# Patient Record
Sex: Female | Born: 1990 | Race: White | Hispanic: No | Marital: Married | Smoking: Never smoker
Health system: Southern US, Community
[De-identification: ages and names within clinical notes are randomized; demographics above are authoritative.]

## PROBLEM LIST (undated history)

## (undated) DIAGNOSIS — E039 Hypothyroidism, unspecified: Secondary | ICD-10-CM

## (undated) HISTORY — PX: NO PAST SURGERIES: SHX2092

---
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
## 2017-08-21 DIAGNOSIS — D225 Melanocytic nevi of trunk: Secondary | ICD-10-CM | POA: Diagnosis not present

## 2017-08-21 DIAGNOSIS — D485 Neoplasm of uncertain behavior of skin: Secondary | ICD-10-CM | POA: Diagnosis not present

## 2017-08-21 DIAGNOSIS — B360 Pityriasis versicolor: Secondary | ICD-10-CM | POA: Diagnosis not present

## 2017-08-29 DIAGNOSIS — R5383 Other fatigue: Secondary | ICD-10-CM | POA: Diagnosis not present

## 2017-08-29 DIAGNOSIS — Z6821 Body mass index (BMI) 21.0-21.9, adult: Secondary | ICD-10-CM | POA: Diagnosis not present

## 2017-08-29 DIAGNOSIS — R002 Palpitations: Secondary | ICD-10-CM | POA: Diagnosis not present

## 2017-08-29 DIAGNOSIS — D510 Vitamin B12 deficiency anemia due to intrinsic factor deficiency: Secondary | ICD-10-CM | POA: Diagnosis not present

## 2017-10-03 DIAGNOSIS — Z01419 Encounter for gynecological examination (general) (routine) without abnormal findings: Secondary | ICD-10-CM | POA: Diagnosis not present

## 2017-10-03 DIAGNOSIS — R5383 Other fatigue: Secondary | ICD-10-CM | POA: Diagnosis not present

## 2017-10-03 DIAGNOSIS — Z6822 Body mass index (BMI) 22.0-22.9, adult: Secondary | ICD-10-CM | POA: Diagnosis not present

## 2017-12-18 DIAGNOSIS — E538 Deficiency of other specified B group vitamins: Secondary | ICD-10-CM | POA: Diagnosis not present

## 2017-12-18 DIAGNOSIS — M791 Myalgia, unspecified site: Secondary | ICD-10-CM | POA: Diagnosis not present

## 2017-12-18 DIAGNOSIS — R5383 Other fatigue: Secondary | ICD-10-CM | POA: Diagnosis not present

## 2017-12-18 DIAGNOSIS — R140 Abdominal distension (gaseous): Secondary | ICD-10-CM | POA: Diagnosis not present

## 2017-12-18 DIAGNOSIS — E049 Nontoxic goiter, unspecified: Secondary | ICD-10-CM | POA: Diagnosis not present

## 2018-02-03 DIAGNOSIS — D510 Vitamin B12 deficiency anemia due to intrinsic factor deficiency: Secondary | ICD-10-CM | POA: Diagnosis not present

## 2018-02-03 DIAGNOSIS — K581 Irritable bowel syndrome with constipation: Secondary | ICD-10-CM | POA: Diagnosis not present

## 2018-02-03 DIAGNOSIS — R140 Abdominal distension (gaseous): Secondary | ICD-10-CM | POA: Diagnosis not present

## 2018-02-26 DIAGNOSIS — E049 Nontoxic goiter, unspecified: Secondary | ICD-10-CM | POA: Diagnosis not present

## 2018-02-26 DIAGNOSIS — E538 Deficiency of other specified B group vitamins: Secondary | ICD-10-CM | POA: Diagnosis not present

## 2018-02-26 DIAGNOSIS — M791 Myalgia, unspecified site: Secondary | ICD-10-CM | POA: Diagnosis not present

## 2018-03-13 DIAGNOSIS — R140 Abdominal distension (gaseous): Secondary | ICD-10-CM | POA: Diagnosis not present

## 2018-03-13 DIAGNOSIS — R1013 Epigastric pain: Secondary | ICD-10-CM | POA: Diagnosis not present

## 2018-06-10 DIAGNOSIS — E559 Vitamin D deficiency, unspecified: Secondary | ICD-10-CM | POA: Diagnosis not present

## 2018-06-10 DIAGNOSIS — E538 Deficiency of other specified B group vitamins: Secondary | ICD-10-CM | POA: Diagnosis not present

## 2018-06-10 DIAGNOSIS — E049 Nontoxic goiter, unspecified: Secondary | ICD-10-CM | POA: Diagnosis not present

## 2018-06-30 DIAGNOSIS — J302 Other seasonal allergic rhinitis: Secondary | ICD-10-CM | POA: Diagnosis not present

## 2018-06-30 DIAGNOSIS — J329 Chronic sinusitis, unspecified: Secondary | ICD-10-CM | POA: Diagnosis not present

## 2018-06-30 DIAGNOSIS — J40 Bronchitis, not specified as acute or chronic: Secondary | ICD-10-CM | POA: Diagnosis not present

## 2018-10-07 DIAGNOSIS — Z6822 Body mass index (BMI) 22.0-22.9, adult: Secondary | ICD-10-CM | POA: Diagnosis not present

## 2018-10-07 DIAGNOSIS — Z01419 Encounter for gynecological examination (general) (routine) without abnormal findings: Secondary | ICD-10-CM | POA: Diagnosis not present

## 2018-11-10 DIAGNOSIS — E039 Hypothyroidism, unspecified: Secondary | ICD-10-CM | POA: Diagnosis not present

## 2018-11-10 DIAGNOSIS — E559 Vitamin D deficiency, unspecified: Secondary | ICD-10-CM | POA: Diagnosis not present

## 2018-11-10 DIAGNOSIS — Z23 Encounter for immunization: Secondary | ICD-10-CM | POA: Diagnosis not present

## 2018-11-10 DIAGNOSIS — R5383 Other fatigue: Secondary | ICD-10-CM | POA: Diagnosis not present

## 2018-11-10 DIAGNOSIS — E049 Nontoxic goiter, unspecified: Secondary | ICD-10-CM | POA: Diagnosis not present

## 2018-11-10 DIAGNOSIS — E538 Deficiency of other specified B group vitamins: Secondary | ICD-10-CM | POA: Diagnosis not present

## 2018-12-23 DIAGNOSIS — Z20828 Contact with and (suspected) exposure to other viral communicable diseases: Secondary | ICD-10-CM | POA: Diagnosis not present

## 2018-12-23 DIAGNOSIS — J3489 Other specified disorders of nose and nasal sinuses: Secondary | ICD-10-CM | POA: Diagnosis not present

## 2019-01-15 DIAGNOSIS — E559 Vitamin D deficiency, unspecified: Secondary | ICD-10-CM | POA: Diagnosis not present

## 2019-03-01 DIAGNOSIS — F4323 Adjustment disorder with mixed anxiety and depressed mood: Secondary | ICD-10-CM | POA: Diagnosis not present

## 2019-03-08 DIAGNOSIS — F4323 Adjustment disorder with mixed anxiety and depressed mood: Secondary | ICD-10-CM | POA: Diagnosis not present

## 2019-03-15 DIAGNOSIS — F4323 Adjustment disorder with mixed anxiety and depressed mood: Secondary | ICD-10-CM | POA: Diagnosis not present

## 2019-03-22 DIAGNOSIS — F4323 Adjustment disorder with mixed anxiety and depressed mood: Secondary | ICD-10-CM | POA: Diagnosis not present

## 2019-03-29 DIAGNOSIS — F4323 Adjustment disorder with mixed anxiety and depressed mood: Secondary | ICD-10-CM | POA: Diagnosis not present

## 2019-04-05 DIAGNOSIS — F4323 Adjustment disorder with mixed anxiety and depressed mood: Secondary | ICD-10-CM | POA: Diagnosis not present

## 2019-04-08 DIAGNOSIS — R5383 Other fatigue: Secondary | ICD-10-CM | POA: Diagnosis not present

## 2019-04-08 DIAGNOSIS — E049 Nontoxic goiter, unspecified: Secondary | ICD-10-CM | POA: Diagnosis not present

## 2019-04-12 DIAGNOSIS — F4323 Adjustment disorder with mixed anxiety and depressed mood: Secondary | ICD-10-CM | POA: Diagnosis not present

## 2019-04-13 DIAGNOSIS — N911 Secondary amenorrhea: Secondary | ICD-10-CM | POA: Diagnosis not present

## 2019-04-20 DIAGNOSIS — N911 Secondary amenorrhea: Secondary | ICD-10-CM | POA: Diagnosis not present

## 2019-04-21 DIAGNOSIS — Z31430 Encounter of female for testing for genetic disease carrier status for procreative management: Secondary | ICD-10-CM | POA: Diagnosis not present

## 2019-04-21 DIAGNOSIS — Z3685 Encounter for antenatal screening for Streptococcus B: Secondary | ICD-10-CM | POA: Diagnosis not present

## 2019-04-21 DIAGNOSIS — Z3481 Encounter for supervision of other normal pregnancy, first trimester: Secondary | ICD-10-CM | POA: Diagnosis not present

## 2019-04-21 LAB — OB RESULTS CONSOLE ABO/RH: RH Type: POSITIVE

## 2019-04-21 LAB — OB RESULTS CONSOLE HEPATITIS B SURFACE ANTIGEN: Hepatitis B Surface Ag: NEGATIVE

## 2019-04-21 LAB — OB RESULTS CONSOLE HIV ANTIBODY (ROUTINE TESTING): HIV: NONREACTIVE

## 2019-04-21 LAB — OB RESULTS CONSOLE RPR: RPR: NONREACTIVE

## 2019-04-21 LAB — OB RESULTS CONSOLE RUBELLA ANTIBODY, IGM: Rubella: IMMUNE

## 2019-05-10 DIAGNOSIS — Z113 Encounter for screening for infections with a predominantly sexual mode of transmission: Secondary | ICD-10-CM | POA: Diagnosis not present

## 2019-05-10 DIAGNOSIS — O26891 Other specified pregnancy related conditions, first trimester: Secondary | ICD-10-CM | POA: Diagnosis not present

## 2019-05-10 DIAGNOSIS — Z3400 Encounter for supervision of normal first pregnancy, unspecified trimester: Secondary | ICD-10-CM | POA: Diagnosis not present

## 2019-05-10 DIAGNOSIS — Z331 Pregnant state, incidental: Secondary | ICD-10-CM | POA: Diagnosis not present

## 2019-05-10 DIAGNOSIS — Z3A10 10 weeks gestation of pregnancy: Secondary | ICD-10-CM | POA: Diagnosis not present

## 2019-05-11 LAB — OB RESULTS CONSOLE GC/CHLAMYDIA
Chlamydia: NEGATIVE
Gonorrhea: NEGATIVE

## 2019-05-20 DIAGNOSIS — Z3481 Encounter for supervision of other normal pregnancy, first trimester: Secondary | ICD-10-CM | POA: Diagnosis not present

## 2019-05-20 DIAGNOSIS — Z3682 Encounter for antenatal screening for nuchal translucency: Secondary | ICD-10-CM | POA: Diagnosis not present

## 2019-05-20 DIAGNOSIS — Z3A12 12 weeks gestation of pregnancy: Secondary | ICD-10-CM | POA: Diagnosis not present

## 2019-06-15 DIAGNOSIS — N390 Urinary tract infection, site not specified: Secondary | ICD-10-CM | POA: Diagnosis not present

## 2019-06-16 DIAGNOSIS — E538 Deficiency of other specified B group vitamins: Secondary | ICD-10-CM | POA: Diagnosis not present

## 2019-06-16 DIAGNOSIS — E559 Vitamin D deficiency, unspecified: Secondary | ICD-10-CM | POA: Diagnosis not present

## 2019-06-16 DIAGNOSIS — E049 Nontoxic goiter, unspecified: Secondary | ICD-10-CM | POA: Diagnosis not present

## 2019-06-16 DIAGNOSIS — R5383 Other fatigue: Secondary | ICD-10-CM | POA: Diagnosis not present

## 2019-07-06 DIAGNOSIS — F4321 Adjustment disorder with depressed mood: Secondary | ICD-10-CM | POA: Diagnosis not present

## 2019-07-13 DIAGNOSIS — Z1152 Encounter for screening for COVID-19: Secondary | ICD-10-CM | POA: Diagnosis not present

## 2019-07-13 DIAGNOSIS — Z363 Encounter for antenatal screening for malformations: Secondary | ICD-10-CM | POA: Diagnosis not present

## 2019-07-13 DIAGNOSIS — Z3A19 19 weeks gestation of pregnancy: Secondary | ICD-10-CM | POA: Diagnosis not present

## 2019-07-13 DIAGNOSIS — Z361 Encounter for antenatal screening for raised alphafetoprotein level: Secondary | ICD-10-CM | POA: Diagnosis not present

## 2019-07-16 DIAGNOSIS — F4321 Adjustment disorder with depressed mood: Secondary | ICD-10-CM | POA: Diagnosis not present

## 2019-07-28 DIAGNOSIS — E049 Nontoxic goiter, unspecified: Secondary | ICD-10-CM | POA: Diagnosis not present

## 2019-07-30 DIAGNOSIS — F4321 Adjustment disorder with depressed mood: Secondary | ICD-10-CM | POA: Diagnosis not present

## 2019-08-12 DIAGNOSIS — N390 Urinary tract infection, site not specified: Secondary | ICD-10-CM | POA: Diagnosis not present

## 2019-08-13 DIAGNOSIS — F4321 Adjustment disorder with depressed mood: Secondary | ICD-10-CM | POA: Diagnosis not present

## 2019-08-17 DIAGNOSIS — Z3A24 24 weeks gestation of pregnancy: Secondary | ICD-10-CM | POA: Diagnosis not present

## 2019-08-17 DIAGNOSIS — Z362 Encounter for other antenatal screening follow-up: Secondary | ICD-10-CM | POA: Diagnosis not present

## 2019-08-27 DIAGNOSIS — F4321 Adjustment disorder with depressed mood: Secondary | ICD-10-CM | POA: Diagnosis not present

## 2019-09-08 DIAGNOSIS — Z23 Encounter for immunization: Secondary | ICD-10-CM | POA: Diagnosis not present

## 2019-09-08 DIAGNOSIS — R030 Elevated blood-pressure reading, without diagnosis of hypertension: Secondary | ICD-10-CM | POA: Diagnosis not present

## 2019-09-10 DIAGNOSIS — F4321 Adjustment disorder with depressed mood: Secondary | ICD-10-CM | POA: Diagnosis not present

## 2019-09-24 DIAGNOSIS — F4321 Adjustment disorder with depressed mood: Secondary | ICD-10-CM | POA: Diagnosis not present

## 2019-09-30 ENCOUNTER — Other Ambulatory Visit: Payer: Self-pay

## 2019-09-30 ENCOUNTER — Inpatient Hospital Stay (HOSPITAL_COMMUNITY)
Admission: AD | Admit: 2019-09-30 | Discharge: 2019-09-30 | Disposition: A | Discharge status: Home / Self Care | Payer: BC Managed Care – PPO | Attending: Obstetrics & Gynecology | Admitting: Obstetrics & Gynecology

## 2019-09-30 ENCOUNTER — Encounter (HOSPITAL_COMMUNITY): Payer: Self-pay | Admitting: Obstetrics & Gynecology

## 2019-09-30 DIAGNOSIS — E039 Hypothyroidism, unspecified: Secondary | ICD-10-CM | POA: Diagnosis not present

## 2019-09-30 DIAGNOSIS — Z79890 Hormone replacement therapy (postmenopausal): Secondary | ICD-10-CM | POA: Insufficient documentation

## 2019-09-30 DIAGNOSIS — O99283 Endocrine, nutritional and metabolic diseases complicating pregnancy, third trimester: Secondary | ICD-10-CM | POA: Diagnosis not present

## 2019-09-30 DIAGNOSIS — Z3A31 31 weeks gestation of pregnancy: Secondary | ICD-10-CM | POA: Diagnosis not present

## 2019-09-30 DIAGNOSIS — O133 Gestational [pregnancy-induced] hypertension without significant proteinuria, third trimester: Secondary | ICD-10-CM | POA: Diagnosis not present

## 2019-09-30 DIAGNOSIS — Z3689 Encounter for other specified antenatal screening: Secondary | ICD-10-CM

## 2019-09-30 HISTORY — DX: Hypothyroidism, unspecified: E03.9

## 2019-09-30 LAB — COMPREHENSIVE METABOLIC PANEL
ALT: 17 U/L (ref 0–44)
AST: 20 U/L (ref 15–41)
Albumin: 2.6 g/dL — ABNORMAL LOW (ref 3.5–5.0)
Alkaline Phosphatase: 85 U/L (ref 38–126)
Anion gap: 10 (ref 5–15)
BUN: 7 mg/dL (ref 6–20)
CO2: 20 mmol/L — ABNORMAL LOW (ref 22–32)
Calcium: 9.4 mg/dL (ref 8.9–10.3)
Chloride: 108 mmol/L (ref 98–111)
Creatinine, Ser: 0.72 mg/dL (ref 0.44–1.00)
GFR calc Af Amer: >60 mL/min (ref >60)
GFR calc non Af Amer: >60 mL/min (ref >60)
Glucose, Bld: 85 mg/dL (ref 70–99)
Potassium: 3.5 mmol/L (ref 3.5–5.1)
Sodium: 138 mmol/L (ref 135–145)
Total Bilirubin: 0.4 mg/dL (ref 0.3–1.2)
Total Protein: 5.1 g/dL — ABNORMAL LOW (ref 6.5–8.1)

## 2019-09-30 LAB — CBC
HCT: 29.6 % — ABNORMAL LOW (ref 36.0–46.0)
Hemoglobin: 10.2 g/dL — ABNORMAL LOW (ref 12.0–15.0)
MCH: 30.4 pg (ref 26.0–34.0)
MCHC: 34.5 g/dL (ref 30.0–36.0)
MCV: 88.1 fL (ref 80.0–100.0)
Platelets: 151 10*3/uL (ref 150–400)
RBC: 3.36 MIL/uL — ABNORMAL LOW (ref 3.87–5.11)
RDW: 12.3 % (ref 11.5–15.5)
WBC: 8.8 10*3/uL (ref 4.0–10.5)
nRBC: 0 % (ref 0.0–0.2)

## 2019-09-30 LAB — PROTEIN / CREATININE RATIO, URINE
Creatinine, Urine: 56.88 mg/dL
Total Protein, Urine: <6 mg/dL

## 2019-09-30 MED ORDER — ACETAMINOPHEN 500 MG PO TABS
1000.0000 mg | ORAL_TABLET | Freq: Four times a day (QID) | ORAL | Status: DC | PRN
  Administered 2019-09-30: 1000 mg via ORAL
  Filled 2019-09-30: qty 2

## 2019-09-30 NOTE — MAU Note (Signed)
Pt sent from doctors office with elevated b/p. C/O mild headache, nausea  and swollen legs and feet(has had that for several weeks). Reports good fetal movement.

## 2019-09-30 NOTE — Discharge Instructions (Signed)

## 2019-09-30 NOTE — MAU Provider Note (Signed)
History     CSN: [id number]  Arrival date and time: 09/30/19 1104   First Provider Initiated Contact with Patient 09/30/19 1153      Chief Complaint  Patient presents with  . Hypertension   28 y.o. G1 @31 .1 wks presenting from office for HTN. Reports increased BPs over the last few visits. Reports generalized HA this am. Rates 3/10. Has not taken anything for it. Denies visual disturbances, CP, RUQ pain, and SOB. Reports good FM. Reports edema of LE and feet for weeks.   OB History    Gravida  1   Para      Term      Preterm      AB      Living        SAB      TAB      Ectopic      Multiple      Live Births              Past Medical History:  Diagnosis Date  . Hypothyroidism     Past Surgical History:  Procedure Laterality Date  . NO PAST SURGERIES      No family history on file.  Social History   Tobacco Use  . Smoking status: Never Smoker  . Smokeless tobacco: Never Used  Substance Use Topics  . Alcohol use: Not Currently  . Drug use: Never    Allergies: No Known Allergies  No medications prior to admission.    Review of Systems  Eyes: Negative for visual disturbance.  Respiratory: Negative for shortness of breath.   Cardiovascular: Negative for chest pain.  Gastrointestinal: Negative for abdominal pain.  Genitourinary: Negative for vaginal bleeding.  Neurological: Positive for headaches.   Physical Exam   Blood pressure 133/89, pulse 62, temperature 99 F (37.2 C), resp. rate 18, height 5\' 7"  (1.702 m), weight 80.7 kg. Patient Vitals for the past 24 hrs:  BP Temp Pulse Resp Height Weight  09/30/19 1400 133/89 -- 62 -- -- --  09/30/19 1345 133/88 -- (!) 57 -- -- --  09/30/19 1330 137/87 -- 62 -- -- --  09/30/19 1315 135/87 -- 62 -- -- --  09/30/19 1300 133/79 -- (!) 59 -- -- --  09/30/19 1245 133/85 -- 63 -- -- --  09/30/19 1230 137/83 -- (!) 55 -- -- --  09/30/19 1215 138/84 -- 71 -- -- --  09/30/19 1200 (!) 147/92 -- 75  -- -- --  09/30/19 1145 138/85 -- 77 -- -- --  09/30/19 1132 (!) 143/101 99 F (37.2 C) 71 18 5\' 7"  (1.702 m) 80.7 kg   Physical Exam Vitals and nursing note reviewed.  Constitutional:      General: She is not in acute distress.    Appearance: Normal appearance.  HENT:     Head: Normocephalic and atraumatic.  Cardiovascular:     Rate and Rhythm: Normal rate.  Pulmonary:     Effort: Pulmonary effort is normal. No respiratory distress.  Musculoskeletal:        General: Swelling (2+ BLE ) present.     Cervical back: Normal range of motion.  Skin:    General: Skin is warm and dry.  Neurological:     General: No focal deficit present.     Mental Status: She is alert and oriented to person, place, and time.  Psychiatric:        Mood and Affect: Mood normal.        Behavior:  Behavior normal.   EFM: 135 bpm, mod variability, + accels, no decels Toco: UI  Results for orders placed or performed during the hospital encounter of 09/30/19 (from the past 24 hour(s))  Protein / creatinine ratio, urine     Status: None   Collection Time: 09/30/19 11:35 AM  Result Value Ref Range   Creatinine, Urine 56.88 mg/dL   Total Protein, Urine <6 mg/dL   Protein Creatinine Ratio        0.00 - 0.15 mg/mg[Cre]  CBC     Status: Abnormal   Collection Time: 09/30/19 11:43 AM  Result Value Ref Range   WBC 8.8 4.0 - 10.5 K/uL   RBC 3.36 (L) 3.87 - 5.11 MIL/uL   Hemoglobin 10.2 (L) 12.0 - 15.0 g/dL   HCT 11.9 (L) 36 - 46 %   MCV 88.1 80.0 - 100.0 fL   MCH 30.4 26.0 - 34.0 pg   MCHC 34.5 30.0 - 36.0 g/dL   RDW 41.7 40.8 - 14.4 %   Platelets 151 150 - 400 K/uL   nRBC 0.0 0.0 - 0.2 %  Comprehensive metabolic panel     Status: Abnormal   Collection Time: 09/30/19 11:43 AM  Result Value Ref Range   Sodium 138 135 - 145 mmol/L   Potassium 3.5 3.5 - 5.1 mmol/L   Chloride 108 98 - 111 mmol/L   CO2 20 (L) 22 - 32 mmol/L   Glucose, Bld 85 70 - 99 mg/dL   BUN 7 6 - 20 mg/dL   Creatinine, Ser 8.18 0.44  - 1.00 mg/dL   Calcium 9.4 8.9 - 56.3 mg/dL   Total Protein 5.1 (L) 6.5 - 8.1 g/dL   Albumin 2.6 (L) 3.5 - 5.0 g/dL   AST 20 15 - 41 U/L   ALT 17 0 - 44 U/L   Alkaline Phosphatase 85 38 - 126 U/L   Total Bilirubin 0.4 0.3 - 1.2 mg/dL   GFR calc non Af Amer >60 >60 mL/min   GFR calc Af Amer >60 >60 mL/min   Anion gap 10 5 - 15   MAU Course  Procedures Tylenol  MDM Labs ordered and reviewed. HA improved. No evidence of PEC. Recommend f/u early next week. Stable for discharge home.  Assessment and Plan   1. [redacted] weeks gestation of pregnancy   2. NST (non-stress test) reactive   3. Gestational hypertension, third trimester    Discharge home Follow up at [facility] next week- message left with office PEC precautions  Allergies as of 09/30/2019   No Known Allergies     Medication List    TAKE these medications   cholecalciferol 25 MCG (1000 UNIT) tablet Commonly known as: VITAMIN D3 Take 1,000 Units by mouth daily.   levothyroxine 25 MCG tablet Commonly known as: SYNTHROID Take 25 mcg by mouth daily before breakfast.   prenatal multivitamin Tabs tablet Take 1 tablet by mouth daily at 12 noon.   vitamin B-12 500 MCG tablet Commonly known as: CYANOCOBALAMIN Take 500 mcg by mouth daily.      Donette Larry, CNM 09/30/2019, 3:38 PM

## 2019-10-04 DIAGNOSIS — Z3A31 31 weeks gestation of pregnancy: Secondary | ICD-10-CM | POA: Diagnosis not present

## 2019-10-04 DIAGNOSIS — O139 Gestational [pregnancy-induced] hypertension without significant proteinuria, unspecified trimester: Secondary | ICD-10-CM | POA: Diagnosis not present

## 2019-10-08 DIAGNOSIS — F4321 Adjustment disorder with depressed mood: Secondary | ICD-10-CM | POA: Diagnosis not present

## 2019-10-11 ENCOUNTER — Inpatient Hospital Stay (HOSPITAL_COMMUNITY)
Admission: AD | Admit: 2019-10-11 | Discharge: 2019-10-18 | DRG: 806 | Disposition: A | Discharge status: Home / Self Care | Payer: BC Managed Care – PPO | Attending: Obstetrics and Gynecology | Admitting: Obstetrics and Gynecology

## 2019-10-11 ENCOUNTER — Encounter (HOSPITAL_COMMUNITY): Payer: Self-pay | Admitting: Obstetrics & Gynecology

## 2019-10-11 ENCOUNTER — Other Ambulatory Visit: Payer: Self-pay

## 2019-10-11 DIAGNOSIS — O99284 Endocrine, nutritional and metabolic diseases complicating childbirth: Secondary | ICD-10-CM | POA: Diagnosis not present

## 2019-10-11 DIAGNOSIS — E039 Hypothyroidism, unspecified: Secondary | ICD-10-CM | POA: Diagnosis not present

## 2019-10-11 DIAGNOSIS — Z20822 Contact with and (suspected) exposure to covid-19: Secondary | ICD-10-CM | POA: Diagnosis not present

## 2019-10-11 DIAGNOSIS — D696 Thrombocytopenia, unspecified: Secondary | ICD-10-CM | POA: Diagnosis present

## 2019-10-11 DIAGNOSIS — Z3A33 33 weeks gestation of pregnancy: Secondary | ICD-10-CM | POA: Diagnosis not present

## 2019-10-11 DIAGNOSIS — R030 Elevated blood-pressure reading, without diagnosis of hypertension: Secondary | ICD-10-CM | POA: Diagnosis not present

## 2019-10-11 DIAGNOSIS — Z3A00 Weeks of gestation of pregnancy not specified: Secondary | ICD-10-CM | POA: Diagnosis not present

## 2019-10-11 DIAGNOSIS — O43899 Other placental disorders, unspecified trimester: Secondary | ICD-10-CM | POA: Diagnosis not present

## 2019-10-11 DIAGNOSIS — Z3A32 32 weeks gestation of pregnancy: Secondary | ICD-10-CM | POA: Diagnosis not present

## 2019-10-11 DIAGNOSIS — O139 Gestational [pregnancy-induced] hypertension without significant proteinuria, unspecified trimester: Secondary | ICD-10-CM | POA: Diagnosis present

## 2019-10-11 DIAGNOSIS — O1414 Severe pre-eclampsia complicating childbirth: Secondary | ICD-10-CM | POA: Diagnosis not present

## 2019-10-11 DIAGNOSIS — O9912 Other diseases of the blood and blood-forming organs and certain disorders involving the immune mechanism complicating childbirth: Secondary | ICD-10-CM | POA: Diagnosis present

## 2019-10-11 DIAGNOSIS — O1413 Severe pre-eclampsia, third trimester: Secondary | ICD-10-CM | POA: Diagnosis not present

## 2019-10-11 DIAGNOSIS — Z23 Encounter for immunization: Secondary | ICD-10-CM

## 2019-10-11 DIAGNOSIS — D649 Anemia, unspecified: Secondary | ICD-10-CM | POA: Diagnosis not present

## 2019-10-11 DIAGNOSIS — Z363 Encounter for antenatal screening for malformations: Secondary | ICD-10-CM | POA: Diagnosis not present

## 2019-10-11 DIAGNOSIS — O99283 Endocrine, nutritional and metabolic diseases complicating pregnancy, third trimester: Secondary | ICD-10-CM | POA: Diagnosis not present

## 2019-10-11 DIAGNOSIS — O164 Unspecified maternal hypertension, complicating childbirth: Secondary | ICD-10-CM | POA: Diagnosis not present

## 2019-10-11 DIAGNOSIS — O9902 Anemia complicating childbirth: Secondary | ICD-10-CM | POA: Diagnosis not present

## 2019-10-11 DIAGNOSIS — O1410 Severe pre-eclampsia, unspecified trimester: Secondary | ICD-10-CM | POA: Diagnosis present

## 2019-10-11 DIAGNOSIS — O133 Gestational [pregnancy-induced] hypertension without significant proteinuria, third trimester: Secondary | ICD-10-CM | POA: Diagnosis not present

## 2019-10-11 LAB — CBC
HCT: 28.5 % — ABNORMAL LOW (ref 36.0–46.0)
Hemoglobin: 9.6 g/dL — ABNORMAL LOW (ref 12.0–15.0)
MCH: 29.9 pg (ref 26.0–34.0)
MCHC: 33.7 g/dL (ref 30.0–36.0)
MCV: 88.8 fL (ref 80.0–100.0)
Platelets: 144 10*3/uL — ABNORMAL LOW (ref 150–400)
RBC: 3.21 MIL/uL — ABNORMAL LOW (ref 3.87–5.11)
RDW: 12.4 % (ref 11.5–15.5)
WBC: 8.1 10*3/uL (ref 4.0–10.5)
nRBC: 0 % (ref 0.0–0.2)

## 2019-10-11 LAB — PROTEIN / CREATININE RATIO, URINE
Creatinine, Urine: 58.49 mg/dL
Protein Creatinine Ratio: 0.99 mg/mg{Cre} — ABNORMAL HIGH (ref 0.00–0.15)
Total Protein, Urine: 58 mg/dL

## 2019-10-11 LAB — COMPREHENSIVE METABOLIC PANEL
ALT: 21 U/L (ref 0–44)
AST: 26 U/L (ref 15–41)
Albumin: 2.4 g/dL — ABNORMAL LOW (ref 3.5–5.0)
Alkaline Phosphatase: 108 U/L (ref 38–126)
Anion gap: 8 (ref 5–15)
BUN: 10 mg/dL (ref 6–20)
CO2: 23 mmol/L (ref 22–32)
Calcium: 8.6 mg/dL — ABNORMAL LOW (ref 8.9–10.3)
Chloride: 107 mmol/L (ref 98–111)
Creatinine, Ser: 0.88 mg/dL (ref 0.44–1.00)
GFR calc Af Amer: >60 mL/min (ref >60)
GFR calc non Af Amer: >60 mL/min (ref >60)
Glucose, Bld: 80 mg/dL (ref 70–99)
Potassium: 3.8 mmol/L (ref 3.5–5.1)
Sodium: 138 mmol/L (ref 135–145)
Total Bilirubin: 0.6 mg/dL (ref 0.3–1.2)
Total Protein: 4.9 g/dL — ABNORMAL LOW (ref 6.5–8.1)

## 2019-10-11 LAB — RESPIRATORY PANEL BY RT PCR (FLU A&B, COVID)
Influenza A by PCR: NEGATIVE
Influenza B by PCR: NEGATIVE
SARS Coronavirus 2 by RT PCR: NEGATIVE

## 2019-10-11 LAB — TYPE AND SCREEN
ABO/RH(D): A POS
Antibody Screen: NEGATIVE

## 2019-10-11 MED ORDER — LABETALOL HCL 200 MG PO TABS
300.0000 mg | ORAL_TABLET | Freq: Three times a day (TID) | ORAL | Status: DC
  Administered 2019-10-11 – 2019-10-13 (×7): 300 mg via ORAL
  Filled 2019-10-11 (×5): qty 1
  Filled 2019-10-11: qty 3
  Filled 2019-10-11: qty 1

## 2019-10-11 MED ORDER — LABETALOL HCL 5 MG/ML IV SOLN
40.0000 mg | INTRAVENOUS | Status: DC | PRN
  Administered 2019-10-11 – 2019-10-15 (×3): 40 mg via INTRAVENOUS
  Filled 2019-10-11 (×3): qty 8

## 2019-10-11 MED ORDER — BETAMETHASONE SOD PHOS & ACET 6 (3-3) MG/ML IJ SUSP
12.0000 mg | INTRAMUSCULAR | Status: AC
  Administered 2019-10-11 – 2019-10-12 (×2): 12 mg via INTRAMUSCULAR
  Filled 2019-10-11: qty 5

## 2019-10-11 MED ORDER — LABETALOL HCL 5 MG/ML IV SOLN
80.0000 mg | INTRAVENOUS | Status: DC | PRN
  Administered 2019-10-11 – 2019-10-15 (×3): 80 mg via INTRAVENOUS
  Filled 2019-10-11 (×3): qty 16

## 2019-10-11 MED ORDER — INFLUENZA VAC SPLIT QUAD 0.5 ML IM SUSY
0.5000 mL | PREFILLED_SYRINGE | INTRAMUSCULAR | Status: AC
  Administered 2019-10-12: 0.5 mL via INTRAMUSCULAR
  Filled 2019-10-11: qty 0.5

## 2019-10-11 MED ORDER — CALCIUM CARBONATE ANTACID 500 MG PO CHEW
2.0000 | CHEWABLE_TABLET | ORAL | Status: DC | PRN
  Administered 2019-10-11 – 2019-10-14 (×5): 400 mg via ORAL
  Filled 2019-10-11 (×5): qty 2

## 2019-10-11 MED ORDER — DOCUSATE SODIUM 100 MG PO CAPS
100.0000 mg | ORAL_CAPSULE | Freq: Every day | ORAL | Status: DC
  Administered 2019-10-12 – 2019-10-14 (×3): 100 mg via ORAL
  Filled 2019-10-11 (×3): qty 1

## 2019-10-11 MED ORDER — PRENATAL MULTIVITAMIN CH
1.0000 | ORAL_TABLET | Freq: Every day | ORAL | Status: DC
  Administered 2019-10-12 – 2019-10-14 (×3): 1 via ORAL
  Filled 2019-10-11 (×3): qty 1

## 2019-10-11 MED ORDER — HYDRALAZINE HCL 20 MG/ML IJ SOLN
10.0000 mg | INTRAMUSCULAR | Status: DC | PRN
  Administered 2019-10-11 – 2019-10-15 (×3): 10 mg via INTRAVENOUS
  Filled 2019-10-11 (×3): qty 1

## 2019-10-11 MED ORDER — LABETALOL HCL 5 MG/ML IV SOLN
20.0000 mg | INTRAVENOUS | Status: DC | PRN
  Administered 2019-10-11 – 2019-10-15 (×3): 20 mg via INTRAVENOUS
  Filled 2019-10-11 (×3): qty 4

## 2019-10-11 MED ORDER — LEVOTHYROXINE SODIUM 25 MCG PO TABS
25.0000 ug | ORAL_TABLET | Freq: Every day | ORAL | Status: DC
  Administered 2019-10-12 – 2019-10-15 (×4): 25 ug via ORAL
  Filled 2019-10-11 (×5): qty 1

## 2019-10-11 MED ORDER — ACETAMINOPHEN 325 MG PO TABS
650.0000 mg | ORAL_TABLET | ORAL | Status: DC | PRN
  Administered 2019-10-11 – 2019-10-15 (×4): 650 mg via ORAL
  Filled 2019-10-11 (×4): qty 2

## 2019-10-11 MED ORDER — ZOLPIDEM TARTRATE 5 MG PO TABS: 5.0000 mg | ORAL_TABLET | Freq: Every evening | ORAL | Status: DC | PRN

## 2019-10-11 NOTE — H&P (Signed)
Sally Cross is a 29 y.o. female G1 at [redacted]w[redacted]d presenting for elevated BPs.  She has been followed closely in the office for [facility] and has been taking labetalol 200 BID.  She has been out of work and on modified bedrest at home but today, noticed elevated BPs in 160s/100s.  She has gained 7 lbs in the last week and reports severe bilateral LE edema.  She was seen in the office where her BP was 175/100 and new 1+ protein.  She denies HA, vision change, RUQ pain, CP/SOB.  Active FM.  Last u/s in the office 9/27 with EFW 3#9 (47%) and normal AFI.  Labs in MAU wnl.  P:C pending on admission.  BMZ#1 received around 430pm.  Antepartum course complicated by hypothyroidism well controlled on synthroid 25 mcg.    OB History    Gravida  1   Para      Term      Preterm      AB      Living        SAB      TAB      Ectopic      Multiple      Live Births             Past Medical History:  Diagnosis Date  . Hypothyroidism    Past Surgical History:  Procedure Laterality Date  . NO PAST SURGERIES     Family History: family history is not on file. Social History:  reports that she has never smoked. She has never used smokeless tobacco. She reports previous alcohol use. She reports that she does not use drugs.     Maternal Diabetes: No Genetic Screening: Normal Maternal Ultrasounds/Referrals: Normal Fetal Ultrasounds or other Referrals:  None Maternal Substance Abuse:  No Significant Maternal Medications:  Meds include: Syntroid Other:  labetalol Significant Maternal Lab Results:  None Other Comments:  None  Review of Systems Maternal Medical History:  Fetal activity: Perceived fetal activity is normal.   Last perceived fetal movement was within the past hour.    Prenatal complications: PIH.   Prenatal Complications - Diabetes: none.      Blood pressure (!) 164/98, pulse (!) 56, temperature 98.4 F (36.9 C), temperature source Oral, resp. rate 17, height 5\' 7"  (1.702 m),  weight 84.6 kg, SpO2 100 %. Maternal Exam:  Uterine Assessment: Contraction strength is mild.  Contraction frequency is rare.   Abdomen: Patient reports no abdominal tenderness. Fundal height is c/w dates.       Fetal Exam Fetal Monitor Review: Baseline rate: 130.  Variability: moderate (6-25 bpm).   Pattern: accelerations present and no decelerations.    Fetal State Assessment: Category I - tracings are normal.     Physical Exam Constitutional:      Appearance: Normal appearance.  HENT:     Head: Normocephalic and atraumatic.  Cardiovascular:     Rate and Rhythm: Normal rate and regular rhythm.  Pulmonary:     Effort: Pulmonary effort is normal.  Abdominal:     Palpations: Abdomen is soft.  Musculoskeletal:        General: Normal range of motion.     Cervical back: Normal range of motion.     Right lower leg: Edema present.     Left lower leg: Edema present.  Skin:    General: Skin is warm and dry.  Neurological:     General: No focal deficit present.     Mental Status: She  is alert and oriented to person, place, and time.  Psychiatric:        Mood and Affect: Mood normal.        Behavior: Behavior normal.     Prenatal labs: ABO, Rh:  A positive Antibody:  Negative Rubella:  Immune RPR:   NR HBsAg:  Negative  HIV:   NR GBS:   unknown  Assessment/Plan: 29yo G1 at [redacted]w[redacted]d with gestational hypertension for r/o pre-eclampsia -Complete BMZ -Increase to labetalol 300 TID; increase prn -Repeat labs in AM -Collect 24 hour urine -SCDs -Magnesium for persistent severe range BPs   Sally Cross 10/11/2019, 4:50 PM

## 2019-10-11 NOTE — MAU Provider Note (Signed)
History     CSN: [id number]  Arrival date and time: 10/11/19 1459   None     Chief Complaint  Patient presents with   Hypertension   HPI This is a 29 year old G1 at 32 weeks and 5 days with a history of hypothyroidism who presents to the office for elevated blood pressures.  She was seen in the MAU on 9/23.  She was discharged home.  Time she had mild range pressures.  She denies headache changes, right upper quadrant pain, nausea, vomiting.  She has good fetal movement and has no cramping, contractions, bleeding.  OB History    Gravida  1   Para      Term      Preterm      AB      Living        SAB      TAB      Ectopic      Multiple      Live Births              Past Medical History:  Diagnosis Date   Hypothyroidism     Past Surgical History:  Procedure Laterality Date   NO PAST SURGERIES      No family history on file.  Social History   Tobacco Use   Smoking status: Never Smoker   Smokeless tobacco: Never Used  Substance Use Topics   Alcohol use: Not Currently   Drug use: Never    Allergies: No Known Allergies  Medications Prior to Admission  Medication Sig Dispense Refill Last Dose   cholecalciferol (VITAMIN D3) 25 MCG (1000 UNIT) tablet Take 1,000 Units by mouth daily.      levothyroxine (SYNTHROID) 25 MCG tablet Take 25 mcg by mouth daily before breakfast.      Prenatal Vit-Fe Fumarate-FA (PRENATAL MULTIVITAMIN) TABS tablet Take 1 tablet by mouth daily at 12 noon.      vitamin B-12 (CYANOCOBALAMIN) 500 MCG tablet Take 500 mcg by mouth daily.       Review of Systems  All other systems reviewed and are negative.  Physical Exam   Blood pressure (!) 159/92, pulse (!) 52, temperature 98.4 F (36.9 C), temperature source Oral, resp. rate 17, height 5\' 7"  (1.702 m), weight 84.6 kg, SpO2 100 %.  Physical Exam Vitals reviewed.  Constitutional:      Appearance: Normal appearance.  HENT:     Mouth/Throat:     Mouth:  Mucous membranes are moist.  Eyes:     Pupils: Pupils are equal, round, and reactive to light.  Cardiovascular:     Rate and Rhythm: Normal rate and regular rhythm.     Pulses: Normal pulses.  Pulmonary:     Effort: Pulmonary effort is normal.  Abdominal:     General: Abdomen is flat.     Palpations: Abdomen is soft.  Musculoskeletal:     Right lower leg: Edema (3+) present.     Left lower leg: Edema (3+) present.  Skin:    General: Skin is warm.     Capillary Refill: Capillary refill takes less than 2 seconds.     Coloration: Skin is not jaundiced or pale.     Findings: No bruising or erythema.  Neurological:     Mental Status: She is alert.  Psychiatric:        Mood and Affect: Mood normal.        Behavior: Behavior normal.        Thought Content:  Thought content normal.        Judgment: Judgment normal.    Results for orders placed or performed during the hospital encounter of 10/11/19 (from the past 24 hour(s))  Comprehensive metabolic panel     Status: Abnormal   Collection Time: 10/11/19  3:22 PM  Result Value Ref Range   Sodium 138 135 - 145 mmol/L   Potassium 3.8 3.5 - 5.1 mmol/L   Chloride 107 98 - 111 mmol/L   CO2 23 22 - 32 mmol/L   Glucose, Bld 80 70 - 99 mg/dL   BUN 10 6 - 20 mg/dL   Creatinine, Ser 0.09 0.44 - 1.00 mg/dL   Calcium 8.6 (L) 8.9 - 10.3 mg/dL   Total Protein 4.9 (L) 6.5 - 8.1 g/dL   Albumin 2.4 (L) 3.5 - 5.0 g/dL   AST 26 15 - 41 U/L   ALT 21 0 - 44 U/L   Alkaline Phosphatase 108 38 - 126 U/L   Total Bilirubin 0.6 0.3 - 1.2 mg/dL   GFR calc non Af Amer >60 >60 mL/min   GFR calc Af Amer >60 >60 mL/min   Anion gap 8 5 - 15  CBC     Status: Abnormal   Collection Time: 10/11/19  3:22 PM  Result Value Ref Range   WBC 8.1 4.0 - 10.5 K/uL   RBC 3.21 (L) 3.87 - 5.11 MIL/uL   Hemoglobin 9.6 (L) 12.0 - 15.0 g/dL   HCT 23.3 (L) 36 - 46 %   MCV 88.8 80.0 - 100.0 fL   MCH 29.9 26.0 - 34.0 pg   MCHC 33.7 30.0 - 36.0 g/dL   RDW 00.7 62.2 - 63.3 %    Platelets 144 (L) 150 - 400 K/uL   nRBC 0.0 0.0 - 0.2 %     MAU Course  Procedures NST reactive: baseline 130s, moderate variability, +accels.  MDM   Assessment and Plan  1. Severe Preeclampsia 2. [redacted] weeks gestation  Discussed patient with Dr Langston Masker, who will admit. BMZ given Labetalol protocol started. Cr increasing slightly - 0.72 on 9/23 to 0.88 today. Will need to watch closely.   Levie Heritage, DO 10/11/2019, 4:12 PM

## 2019-10-11 NOTE — MAU Note (Signed)
Sent in for elevated BP, is on Labetalol 200mg  BID, took it this morning.  Denies HA,visual changes, epigastric pain.  Ankles and feet are very swollen.  Denies bleeding or leaking.  +FM

## 2019-10-12 ENCOUNTER — Encounter (HOSPITAL_COMMUNITY): Payer: Self-pay | Admitting: Obstetrics & Gynecology

## 2019-10-12 DIAGNOSIS — E039 Hypothyroidism, unspecified: Secondary | ICD-10-CM | POA: Diagnosis present

## 2019-10-12 DIAGNOSIS — R030 Elevated blood-pressure reading, without diagnosis of hypertension: Secondary | ICD-10-CM | POA: Diagnosis present

## 2019-10-12 DIAGNOSIS — O99283 Endocrine, nutritional and metabolic diseases complicating pregnancy, third trimester: Secondary | ICD-10-CM | POA: Diagnosis not present

## 2019-10-12 DIAGNOSIS — O133 Gestational [pregnancy-induced] hypertension without significant proteinuria, third trimester: Secondary | ICD-10-CM | POA: Diagnosis not present

## 2019-10-12 DIAGNOSIS — D696 Thrombocytopenia, unspecified: Secondary | ICD-10-CM | POA: Diagnosis present

## 2019-10-12 DIAGNOSIS — Z20822 Contact with and (suspected) exposure to covid-19: Secondary | ICD-10-CM | POA: Diagnosis present

## 2019-10-12 DIAGNOSIS — Z363 Encounter for antenatal screening for malformations: Secondary | ICD-10-CM | POA: Diagnosis not present

## 2019-10-12 DIAGNOSIS — O99284 Endocrine, nutritional and metabolic diseases complicating childbirth: Secondary | ICD-10-CM | POA: Diagnosis present

## 2019-10-12 DIAGNOSIS — O1414 Severe pre-eclampsia complicating childbirth: Secondary | ICD-10-CM | POA: Diagnosis present

## 2019-10-12 DIAGNOSIS — Z3A33 33 weeks gestation of pregnancy: Secondary | ICD-10-CM | POA: Diagnosis not present

## 2019-10-12 DIAGNOSIS — O1413 Severe pre-eclampsia, third trimester: Secondary | ICD-10-CM | POA: Diagnosis not present

## 2019-10-12 DIAGNOSIS — Z23 Encounter for immunization: Secondary | ICD-10-CM | POA: Diagnosis not present

## 2019-10-12 DIAGNOSIS — O9912 Other diseases of the blood and blood-forming organs and certain disorders involving the immune mechanism complicating childbirth: Secondary | ICD-10-CM | POA: Diagnosis present

## 2019-10-12 DIAGNOSIS — Z3A32 32 weeks gestation of pregnancy: Secondary | ICD-10-CM | POA: Diagnosis not present

## 2019-10-12 LAB — CBC
HCT: 29 % — ABNORMAL LOW (ref 36.0–46.0)
Hemoglobin: 9.5 g/dL — ABNORMAL LOW (ref 12.0–15.0)
MCH: 28.8 pg (ref 26.0–34.0)
MCHC: 32.8 g/dL (ref 30.0–36.0)
MCV: 87.9 fL (ref 80.0–100.0)
Platelets: 135 10*3/uL — ABNORMAL LOW (ref 150–400)
RBC: 3.3 MIL/uL — ABNORMAL LOW (ref 3.87–5.11)
RDW: 12.3 % (ref 11.5–15.5)
WBC: 9 10*3/uL (ref 4.0–10.5)
nRBC: 0 % (ref 0.0–0.2)

## 2019-10-12 LAB — COMPREHENSIVE METABOLIC PANEL
ALT: 21 U/L (ref 0–44)
AST: 24 U/L (ref 15–41)
Albumin: 2.2 g/dL — ABNORMAL LOW (ref 3.5–5.0)
Alkaline Phosphatase: 98 U/L (ref 38–126)
Anion gap: 11 (ref 5–15)
BUN: 10 mg/dL (ref 6–20)
CO2: 18 mmol/L — ABNORMAL LOW (ref 22–32)
Calcium: 8.8 mg/dL — ABNORMAL LOW (ref 8.9–10.3)
Chloride: 107 mmol/L (ref 98–111)
Creatinine, Ser: 0.92 mg/dL (ref 0.44–1.00)
GFR calc Af Amer: >60 mL/min (ref >60)
GFR calc non Af Amer: >60 mL/min (ref >60)
Glucose, Bld: 96 mg/dL (ref 70–99)
Potassium: 3.9 mmol/L (ref 3.5–5.1)
Sodium: 136 mmol/L (ref 135–145)
Total Bilirubin: 0.6 mg/dL (ref 0.3–1.2)
Total Protein: 4.7 g/dL — ABNORMAL LOW (ref 6.5–8.1)

## 2019-10-12 LAB — PROTEIN, URINE, 24 HOUR
Collection Interval-UPROT: 24 hours
Protein, 24H Urine: 427 mg/d — ABNORMAL HIGH (ref 50–100)
Protein, Urine: 61 mg/dL
Urine Total Volume-UPROT: 700 mL

## 2019-10-12 NOTE — Progress Notes (Signed)
Antepartum Progress Note  Sally Cross is a 29 y.o. G1P0 at [redacted]w[redacted]d admitted for preeclampsia.  S: Denies overnight events. Continues to deny headache, vision changes, RUQ or epigastric pain.  Reports good FM. Denies contractions, leakage of fluid, vaginal bleeding.  O: Vitals:   10/11/19 2340 10/12/19 0447  BP: 129/80 131/75  Pulse: 77 89  Resp: 18 18  Temp: 98.3 F (36.8 C) 98.2 F (36.8 C)  SpO2: 97% 100%   Gen: alert, no distress Chest: nonlabored breathing Ab: soft, gravid, nontender Ext: 2+ edema bilateral, symetric  FHT: 1x variable decel @ 0730 today  A/P: - Admitted to antepartum for inpatient management of preeclampsia with severe features (SRBP).  S/p labetalol IV 10/4. - Home labetalol increased to 300 mg TID.  Normotensive this AM, will continue. - Cr 0.88 > 0.92, PLT 144 > 135 this AM.  Will repeat labs tomorrow AM as well. - Will switch to TID monitoring, FHT reactive and reassuring. - BMZ 10/4 and 10/5. - 24 hr urine protein being collected - Has not received mag gtt, will consider if status worsens. - Hopeful for stable condition until 34w GA.  Nilda Simmer MD

## 2019-10-13 ENCOUNTER — Inpatient Hospital Stay (HOSPITAL_BASED_OUTPATIENT_CLINIC_OR_DEPARTMENT_OTHER): Payer: BC Managed Care – PPO

## 2019-10-13 DIAGNOSIS — O1413 Severe pre-eclampsia, third trimester: Secondary | ICD-10-CM

## 2019-10-13 DIAGNOSIS — O99283 Endocrine, nutritional and metabolic diseases complicating pregnancy, third trimester: Secondary | ICD-10-CM

## 2019-10-13 DIAGNOSIS — Z363 Encounter for antenatal screening for malformations: Secondary | ICD-10-CM

## 2019-10-13 DIAGNOSIS — E039 Hypothyroidism, unspecified: Secondary | ICD-10-CM

## 2019-10-13 DIAGNOSIS — Z3A33 33 weeks gestation of pregnancy: Secondary | ICD-10-CM

## 2019-10-13 DIAGNOSIS — O133 Gestational [pregnancy-induced] hypertension without significant proteinuria, third trimester: Secondary | ICD-10-CM

## 2019-10-13 LAB — COMPREHENSIVE METABOLIC PANEL
ALT: 21 U/L (ref 0–44)
AST: 24 U/L (ref 15–41)
Albumin: 2.2 g/dL — ABNORMAL LOW (ref 3.5–5.0)
Alkaline Phosphatase: 97 U/L (ref 38–126)
Anion gap: 9 (ref 5–15)
BUN: 12 mg/dL (ref 6–20)
CO2: 19 mmol/L — ABNORMAL LOW (ref 22–32)
Calcium: 8.8 mg/dL — ABNORMAL LOW (ref 8.9–10.3)
Chloride: 108 mmol/L (ref 98–111)
Creatinine, Ser: 0.99 mg/dL (ref 0.44–1.00)
GFR calc non Af Amer: >60 mL/min (ref >60)
Glucose, Bld: 105 mg/dL — ABNORMAL HIGH (ref 70–99)
Potassium: 3.8 mmol/L (ref 3.5–5.1)
Sodium: 136 mmol/L (ref 135–145)
Total Bilirubin: 0.3 mg/dL (ref 0.3–1.2)
Total Protein: 4.8 g/dL — ABNORMAL LOW (ref 6.5–8.1)

## 2019-10-13 LAB — CBC
HCT: 28.1 % — ABNORMAL LOW (ref 36.0–46.0)
Hemoglobin: 9.4 g/dL — ABNORMAL LOW (ref 12.0–15.0)
MCH: 29.7 pg (ref 26.0–34.0)
MCHC: 33.5 g/dL (ref 30.0–36.0)
MCV: 88.6 fL (ref 80.0–100.0)
Platelets: 167 10*3/uL (ref 150–400)
RBC: 3.17 MIL/uL — ABNORMAL LOW (ref 3.87–5.11)
RDW: 12.5 % (ref 11.5–15.5)
WBC: 10.6 10*3/uL — ABNORMAL HIGH (ref 4.0–10.5)
nRBC: 0.4 % — ABNORMAL HIGH (ref 0.0–0.2)

## 2019-10-13 MED ORDER — LABETALOL HCL 200 MG PO TABS
400.0000 mg | ORAL_TABLET | Freq: Three times a day (TID) | ORAL | Status: DC
  Administered 2019-10-13 – 2019-10-15 (×7): 400 mg via ORAL
  Filled 2019-10-13 (×7): qty 2

## 2019-10-13 NOTE — Progress Notes (Addendum)
Patient ID: Sally Cross, female   DOB: 1990-08-13, 29 y.o.   MRN: [id number]  Gentry is doing well We discussed the MFM recommendations and will plan on IOL at 34 weeks   VS  10/05 0700  10/06 0659 10/06 0700  10/06 1625  Most Recent     Temp (F)    98.1-98.5 97.7-98.7  98.7 (37.1)  10/06 1220  Pulse Rate    79-93 55Important-69  55Important  10/06 1220  Resp    18 18  18   10/06 1220  BP    124/75-141/88Important 145/90Important-151/93Important  145/90Important  10/06 1220  SpO2 (%)    97-100 98-99  98  10/06 1220    FHR 140s with accesl     Recommendations:( Per MFM) -Continue inpatient management until delivery -Continue labetalol for treatment of her elevated blood pressures -Repeat PIH labs every 3 days -Goal for delivery would be 34 weeks or greater -Delivery prior to 34 weeks would be indicated as noted above -Magnesium sulfate for maternal seizure prophylaxis to be started at the time of delivery and to be continued for 24 hours postpartum  I will increase her labetalol up to 400mg  TID and we can adjust as needed Labs in 3 days Induction scheduled as 2 stage at 34 weeks. DL

## 2019-10-13 NOTE — Progress Notes (Signed)
Patient ID: Sally Cross, female   DOB: 1990-05-31, 29 y.o.   MRN: [id number] Sally Cross has no complaints this morning. Denies HA, scotomata, RUQ pain Reports GFM No Ctxs  VS   10/05 0700  10/06 0659 10/06 0700  10/06 1008  Most Recent    Temp (F)    98.1-98.5 97.7  97.7 (36.5)  10/06 0800  Pulse Rate    79-93 69  69  10/06 0800  Resp    18 18  18   10/06 0800  BP    124/75-141/88Important 151/93Important  151/93Important  10/06 0800  SpO2 (%)    97-100 99  99  10/06 0800    FHR 140s with accels Ctxs rare  ABd Gravid nt DTRs 3/4 no clonus  IUP at 33 0/7 weeks Admitted to antepartum for inpatient management of preeclampsia with severe features (SRBP).  S/p labetalol IV 10/4. - Home labetalol increased to 300 mg TID.  Normotensive  Yesterday Labs essentially normal (cbc and cmet). - Will switch to TID monitoring, FHT reactive and reassuring. - BMZ 10/4 and 10/5. Has not received Magnesium Will order US/BPP today and consult MFM If continues with severe range BPs, would consider delivery at 34 weeks.  Otherwise, if stable like yesterday, we might consider 37weeks.  Would like MFM input.

## 2019-10-13 NOTE — Consult Note (Signed)
MFM Note  Sally Cross is a 29 year old gravida 1 para 0 currently at 33 weeks.  She was admitted two days ago with severe range blood pressures requiring IV labetalol for control. She reports that she was first diagnosed with gestational hypertension about 10 days ago and was started on labetalol for treatment at that time.  She notes a 7 pound weight gain over the past week.    It was noted that her blood pressure started to increase despite labetalol treatment about two days ago.  She was admitted for observation during which time she received a complete course of antenatal corticosteroids and her labetalol was increased to 300 mg 3 times a day.  Her 24-hour urine showed 427 mg of protein, indicating that she does have preeclampsia.  Her serum creatinine levels have been increasing (0.99 today).  Her blood pressures since being admitted have been in the 140s to 150s over 80s to 90s range.  She is currently asymptomatic.  The patient denies any history of hypertension prior to pregnancy.  She had an ultrasound performed today that showed an EFW of 4 pounds (11th percentile for her gestational age).  There was normal amniotic fluid noted.  A biophysical profile performed today was 8 out of 8.  Her past medical history includes hypothyroidism that is treated with levothyroxine.    She denies any past surgical history and denies any alcohol tobacco or illegal drug use.    Her current medications include labetalol, levothyroxine, B12, and vitamin D.  The implications and management of preeclampsia was discussed with the patient.  She was advised that preeclampsia can affect both the mother and the fetus.  In the mother, preeclampsia may cause a rise in blood pressures and it can affect the mother's kidney, liver and platelet functions.  It may also cause the mother to have seizures.  In the fetus, it may cause growth restriction and oligohydramnios.  She understands that delivery is the only treatment  for preeclampsia.  Due to preeclampsia, I would recommend inpatient management until delivery.  She may be continued on labetalol for treatment of her blood pressures.  The goal for delivery would be 34 weeks or greater.  Indications for immediate delivery would be: -if her blood pressures continue to be in the 150s over high 90s to 100s range despite treatment with medication -should she have severe range blood pressures requiring further IV push medication -should she complain of any signs or symptoms of severe preeclampsia  -should her PIH labs show any abnormalities -at any time for nonreassuring fetal status.   As her PIH labs are within normal limits other than the high normal serum creatinine levels, her labs should be repeated once every 3 days.  She should receive magnesium sulfate for maternal seizure prophylaxis once a decision is made for delivery.  Magnesium sulfate should be continued for 24 hours postpartum.  The patient was reassured that the neonatal outcome for delivery at her current gestational age and beyond is usually good.    She was advised to start taking a daily baby aspirin during the first trimester of her future pregnancy for preeclampsia prophylaxis.    At the end of the consultation, the patient stated that all of her questions have been answered to her complete satisfaction.    Thank you for referring this patient for a Maternal-Fetal Medicine consultation.  Recommendations: -Continue inpatient management until delivery -Continue labetalol for treatment of her elevated blood pressures -Repeat PIH labs every 3 days -  Goal for delivery would be 34 weeks or greater -Delivery prior to 34 weeks would be indicated as noted above -Magnesium sulfate for maternal seizure prophylaxis to be started at the time of delivery and to be continued for 24 hours postpartum

## 2019-10-14 NOTE — Plan of Care (Signed)
  Problem: Education: Goal: Knowledge of disease or condition will improve Outcome: Progressing   Problem: Elimination: Goal: Will not experience complications related to urinary retention Outcome: Completed/Met   Problem: Safety: Goal: Ability to remain free from injury will improve Outcome: Completed/Met

## 2019-10-14 NOTE — Progress Notes (Signed)
Problems: IUP at 33 w 1 day Severe Preeclampsia  S:  Patient is tearful this morning otherwise no complaints.  O:  BP (!) 152/94   Pulse 85   Temp 98.6 F (37 C) (Oral)   Resp 16   Ht 5\' 7"  (1.702 m)   Wt 84.6 kg   SpO2 98%   BMI 29.21 kg/m  No results found for this or any previous visit (from the past 24 hour(s)). Scheduled Meds: . docusate sodium  100 mg Oral Daily  . labetalol  400 mg Oral Q8H  . levothyroxine  25 mcg Oral Q0600  . prenatal multivitamin  1 tablet Oral Q1200   Continuous Infusions: PRN Meds:.acetaminophen, calcium carbonate, labetalol **AND** labetalol **AND** labetalol **AND** hydrALAZINE **AND** Measure blood pressure, zolpidem  IMPRESSION: IUP at 33 w 1 day Severe Preeclampsia   PLAN: Continue inpatient management  Continue Labetalol 400 mg po tid PIH labs every 3 days  Deliver at 34 weeks

## 2019-10-15 ENCOUNTER — Inpatient Hospital Stay (HOSPITAL_COMMUNITY): Payer: BC Managed Care – PPO | Admitting: Anesthesiology

## 2019-10-15 ENCOUNTER — Encounter (HOSPITAL_COMMUNITY): Payer: Self-pay | Admitting: Obstetrics and Gynecology

## 2019-10-15 DIAGNOSIS — Z3A00 Weeks of gestation of pregnancy not specified: Secondary | ICD-10-CM | POA: Diagnosis not present

## 2019-10-15 DIAGNOSIS — O43899 Other placental disorders, unspecified trimester: Secondary | ICD-10-CM | POA: Diagnosis not present

## 2019-10-15 DIAGNOSIS — O1410 Severe pre-eclampsia, unspecified trimester: Secondary | ICD-10-CM | POA: Diagnosis present

## 2019-10-15 LAB — COMPREHENSIVE METABOLIC PANEL
ALT: 34 U/L (ref 0–44)
ALT: 32 U/L (ref 0–44)
AST: 40 U/L (ref 15–41)
AST: 41 U/L (ref 15–41)
Albumin: 2.1 g/dL — ABNORMAL LOW (ref 3.5–5.0)
Albumin: 2.2 g/dL — ABNORMAL LOW (ref 3.5–5.0)
Alkaline Phosphatase: 95 U/L (ref 38–126)
Alkaline Phosphatase: 100 U/L (ref 38–126)
Anion gap: 9 (ref 5–15)
Anion gap: 11 (ref 5–15)
BUN: 17 mg/dL (ref 6–20)
BUN: 17 mg/dL (ref 6–20)
CO2: 19 mmol/L — ABNORMAL LOW (ref 22–32)
CO2: 18 mmol/L — ABNORMAL LOW (ref 22–32)
Calcium: 8.6 mg/dL — ABNORMAL LOW (ref 8.9–10.3)
Calcium: 8.4 mg/dL — ABNORMAL LOW (ref 8.9–10.3)
Chloride: 110 mmol/L (ref 98–111)
Chloride: 106 mmol/L (ref 98–111)
Creatinine, Ser: 1.02 mg/dL — ABNORMAL HIGH (ref 0.44–1.00)
Creatinine, Ser: 1.1 mg/dL — ABNORMAL HIGH (ref 0.44–1.00)
GFR calc non Af Amer: >60 mL/min (ref >60)
GFR, Estimated: >60 mL/min (ref >60)
Glucose, Bld: 76 mg/dL (ref 70–99)
Glucose, Bld: 79 mg/dL (ref 70–99)
Potassium: 3.8 mmol/L (ref 3.5–5.1)
Potassium: 3.9 mmol/L (ref 3.5–5.1)
Sodium: 138 mmol/L (ref 135–145)
Sodium: 135 mmol/L (ref 135–145)
Total Bilirubin: 0.4 mg/dL (ref 0.3–1.2)
Total Bilirubin: 0.4 mg/dL (ref 0.3–1.2)
Total Protein: 4.3 g/dL — ABNORMAL LOW (ref 6.5–8.1)
Total Protein: 4.5 g/dL — ABNORMAL LOW (ref 6.5–8.1)

## 2019-10-15 LAB — CBC
HCT: 28.6 % — ABNORMAL LOW (ref 36.0–46.0)
HCT: 29.6 % — ABNORMAL LOW (ref 36.0–46.0)
HCT: 30.3 % — ABNORMAL LOW (ref 36.0–46.0)
Hemoglobin: 9.7 g/dL — ABNORMAL LOW (ref 12.0–15.0)
Hemoglobin: 9.9 g/dL — ABNORMAL LOW (ref 12.0–15.0)
Hemoglobin: 9.8 g/dL — ABNORMAL LOW (ref 12.0–15.0)
MCH: 30.1 pg (ref 26.0–34.0)
MCH: 29.6 pg (ref 26.0–34.0)
MCH: 28.5 pg (ref 26.0–34.0)
MCHC: 33.9 g/dL (ref 30.0–36.0)
MCHC: 33.4 g/dL (ref 30.0–36.0)
MCHC: 32.3 g/dL (ref 30.0–36.0)
MCV: 88.8 fL (ref 80.0–100.0)
MCV: 88.4 fL (ref 80.0–100.0)
MCV: 88.1 fL (ref 80.0–100.0)
Platelets: 119 10*3/uL — ABNORMAL LOW (ref 150–400)
Platelets: 123 10*3/uL — ABNORMAL LOW (ref 150–400)
Platelets: 122 10*3/uL — ABNORMAL LOW (ref 150–400)
RBC: 3.22 MIL/uL — ABNORMAL LOW (ref 3.87–5.11)
RBC: 3.35 MIL/uL — ABNORMAL LOW (ref 3.87–5.11)
RBC: 3.44 MIL/uL — ABNORMAL LOW (ref 3.87–5.11)
RDW: 12.6 % (ref 11.5–15.5)
RDW: 12.6 % (ref 11.5–15.5)
RDW: 12.5 % (ref 11.5–15.5)
WBC: 8.7 10*3/uL (ref 4.0–10.5)
WBC: 9.9 10*3/uL (ref 4.0–10.5)
WBC: 8.8 10*3/uL (ref 4.0–10.5)
nRBC: 0.2 % (ref 0.0–0.2)
nRBC: 0.3 % — ABNORMAL HIGH (ref 0.0–0.2)
nRBC: 0 % (ref 0.0–0.2)

## 2019-10-15 LAB — TYPE AND SCREEN
ABO/RH(D): A POS
Antibody Screen: NEGATIVE

## 2019-10-15 LAB — GROUP B STREP BY PCR: Group B strep by PCR: NEGATIVE

## 2019-10-15 MED ORDER — LABETALOL HCL 5 MG/ML IV SOLN
20.0000 mg | INTRAVENOUS | Status: DC | PRN
  Administered 2019-10-15: 20 mg via INTRAVENOUS
  Filled 2019-10-15: qty 4

## 2019-10-15 MED ORDER — BUPIVACAINE HCL (PF) 0.75 % IJ SOLN
INTRAMUSCULAR | Status: DC | PRN
Start: 2019-10-15 — End: 2019-10-15
  Administered 2019-10-15: 12 mL/h via EPIDURAL

## 2019-10-15 MED ORDER — OXYTOCIN BOLUS FROM INFUSION
333.0000 mL | Freq: Once | INTRAVENOUS | Status: AC
  Administered 2019-10-15: 333 mL via INTRAVENOUS

## 2019-10-15 MED ORDER — TERBUTALINE SULFATE 1 MG/ML IJ SOLN: 0.2500 mg | Freq: Once | INTRAMUSCULAR | Status: DC | PRN

## 2019-10-15 MED ORDER — SOD CITRATE-CITRIC ACID 500-334 MG/5ML PO SOLN
30.0000 mL | ORAL | Status: DC | PRN
  Administered 2019-10-15: 30 mL via ORAL
  Filled 2019-10-15: qty 15

## 2019-10-15 MED ORDER — LABETALOL HCL 5 MG/ML IV SOLN
40.0000 mg | INTRAVENOUS | Status: DC | PRN
  Filled 2019-10-15: qty 8

## 2019-10-15 MED ORDER — LIDOCAINE HCL (PF) 1 % IJ SOLN
INTRAMUSCULAR | Status: DC | PRN
  Administered 2019-10-15: 5 mL via EPIDURAL
  Administered 2019-10-15: 8 mL via EPIDURAL

## 2019-10-15 MED ORDER — LACTATED RINGERS IV SOLN: INTRAVENOUS | Status: DC

## 2019-10-15 MED ORDER — MAGNESIUM SULFATE 40 GM/1000ML IV SOLN
2.0000 g/h | INTRAVENOUS | Status: DC
  Filled 2019-10-15: qty 1000

## 2019-10-15 MED ORDER — MAGNESIUM SULFATE BOLUS VIA INFUSION
4.0000 g | Freq: Once | INTRAVENOUS | Status: AC
  Administered 2019-10-15: 4 g via INTRAVENOUS
  Filled 2019-10-15: qty 1000

## 2019-10-15 MED ORDER — OXYTOCIN-SODIUM CHLORIDE 30-0.9 UT/500ML-% IV SOLN
2.5000 [IU]/h | INTRAVENOUS | Status: DC
  Filled 2019-10-15: qty 500

## 2019-10-15 MED ORDER — HYDRALAZINE HCL 20 MG/ML IJ SOLN: 10.0000 mg | INTRAMUSCULAR | Status: DC | PRN

## 2019-10-15 MED ORDER — OXYTOCIN-SODIUM CHLORIDE 30-0.9 UT/500ML-% IV SOLN
1.0000 m[IU]/min | INTRAVENOUS | Status: DC
  Administered 2019-10-15: 6 m[IU]/min via INTRAVENOUS
  Administered 2019-10-15: 2 m[IU]/min via INTRAVENOUS

## 2019-10-15 MED ORDER — OXYCODONE-ACETAMINOPHEN 5-325 MG PO TABS: 2.0000 | ORAL_TABLET | ORAL | Status: DC | PRN

## 2019-10-15 MED ORDER — PHENYLEPHRINE 40 MCG/ML (10ML) SYRINGE FOR IV PUSH (FOR BLOOD PRESSURE SUPPORT): 80.0000 ug | PREFILLED_SYRINGE | INTRAVENOUS | Status: DC | PRN

## 2019-10-15 MED ORDER — PHENYLEPHRINE 40 MCG/ML (10ML) SYRINGE FOR IV PUSH (FOR BLOOD PRESSURE SUPPORT)
80.0000 ug | PREFILLED_SYRINGE | INTRAVENOUS | Status: DC | PRN
  Filled 2019-10-15 (×2): qty 10

## 2019-10-15 MED ORDER — EPHEDRINE 5 MG/ML INJ: 10.0000 mg | INTRAVENOUS | Status: DC | PRN

## 2019-10-15 MED ORDER — DIPHENHYDRAMINE HCL 50 MG/ML IJ SOLN: 12.5000 mg | INTRAMUSCULAR | Status: DC | PRN

## 2019-10-15 MED ORDER — LACTATED RINGERS IV SOLN: 500.0000 mL | INTRAVENOUS | Status: DC | PRN

## 2019-10-15 MED ORDER — OXYCODONE-ACETAMINOPHEN 5-325 MG PO TABS: 1.0000 | ORAL_TABLET | ORAL | Status: DC | PRN

## 2019-10-15 MED ORDER — LIDOCAINE HCL (PF) 1 % IJ SOLN
30.0000 mL | INTRAMUSCULAR | Status: AC | PRN
  Administered 2019-10-15: 30 mL via SUBCUTANEOUS
  Filled 2019-10-15: qty 30

## 2019-10-15 MED ORDER — LABETALOL HCL 5 MG/ML IV SOLN: 80.0000 mg | INTRAVENOUS | Status: DC | PRN

## 2019-10-15 MED ORDER — FENTANYL-BUPIVACAINE-NACL 0.5-0.125-0.9 MG/250ML-% EP SOLN
12.0000 mL/h | EPIDURAL | Status: DC | PRN
  Filled 2019-10-15 (×2): qty 250

## 2019-10-15 MED ORDER — LACTATED RINGERS IV SOLN: 500.0000 mL | Freq: Once | INTRAVENOUS | Status: DC

## 2019-10-15 MED ORDER — ONDANSETRON HCL 4 MG/2ML IJ SOLN
4.0000 mg | Freq: Four times a day (QID) | INTRAMUSCULAR | Status: DC | PRN
  Administered 2019-10-15: 4 mg via INTRAVENOUS
  Filled 2019-10-15: qty 2

## 2019-10-15 MED ORDER — ACETAMINOPHEN 325 MG PO TABS
650.0000 mg | ORAL_TABLET | ORAL | Status: DC | PRN
  Administered 2019-10-15: 650 mg via ORAL
  Filled 2019-10-15: qty 2

## 2019-10-15 NOTE — Progress Notes (Signed)
Today's Vitals   10/15/19 1745 10/15/19 1755 10/15/19 1800 10/15/19 1830  BP:   139/89 (!) 145/93  Pulse:   78 94  Resp:   17 16  Temp:      TempSrc:      SpO2: 95% 96% 94%   Weight:      Height:      PainSc:       Body mass index is 29.21 kg/m.   FHT cat one Cx 3/80/-1/bloody show AROM clear UC  Q1.5-3 min

## 2019-10-15 NOTE — Progress Notes (Signed)
Delivery Note At 10:40 PM a viable female was delivered via  (Presentation:OA      ).  APGAR: , ; weight 3 lb 8.1 oz (1590 g).   Placenta status:intact  , to path .  Cord: 3 vessels  with the following complications:  .  Cord pH: pending  Anesthesia:   Episiotomy:  Second degree ML repaired Lacerations:   Suture Repair: 2.0 vicryl rapide Est. Blood Loss (mL):  200  Mom to postpartum.  Baby to Couplet care / Skin to Skin.  Roselle Locus II 10/15/2019, 10:59 PM

## 2019-10-15 NOTE — Consult Note (Signed)
Delivery Note:  SVD    10/15/2019  10:57 PM  I was called to the delivery room at the request of the patient's obstetrician (Dr. Tomblin) for preterm delivery at [redacted] weeks gestation.  PRENATAL HX:  She is a 29 y/o G1P0 at 33 and 2/[redacted] weeks gestation who was admitted on 10/4 for preeclampsia.  She received BMZ on 10/4 and 10/5.  She is on magnesium.  Labor is now being induced as she has worsening blood pressure and thrombocytopenia (123k).  She is GBS negative with AROM x3 hours.     DELIVERY:  Cord clamping delayed for 60 seconds.  Infant was vigorous at delivery, requiring no resuscitation other than standard warming, drying and stimulation.  APGARs 8 and 9.  Exam within normal limits.  Will admit to NICU for prematurity.     _____________________ Electronically Signed By: Beverly Ferner, MD Neonatologist  

## 2019-10-15 NOTE — Anesthesia Procedure Notes (Signed)
Epidural Patient location during procedure: OB Start time: 10/15/2019 5:38 PM End time: 10/15/2019 5:42 PM  Staffing Anesthesiologist: Leilani Able, MD Performed: anesthesiologist   Preanesthetic Checklist Completed: patient identified, IV checked, site marked, risks and benefits discussed, surgical consent, monitors and equipment checked, pre-op evaluation and timeout performed  Epidural Patient position: sitting Prep: DuraPrep and site prepped and draped Patient monitoring: continuous pulse ox and blood pressure Approach: midline Location: L3-L4 Injection technique: LOR air  Needle:  Needle type: Tuohy  Needle gauge: 17 G Needle length: 9 cm and 9 Needle insertion depth: 5 cm cm Catheter type: closed end flexible Catheter size: 19 Gauge Catheter at skin depth: 10 cm Test dose: negative and Other  Assessment Events: blood not aspirated, injection not painful, no injection resistance, no paresthesia and negative IV test  Additional Notes Reason for block:procedure for pain

## 2019-10-15 NOTE — Progress Notes (Signed)
FHT cat one UCs irregular, difficult to trace Patient says she feels some UCs Cx 2/80/-1 BP stable  Results for orders placed or performed during the hospital encounter of 10/11/19 (from the past 24 hour(s))  Type and screen [facility]     Status: None   Collection Time: 10/15/19  4:59 AM  Result Value Ref Range   ABO/RH(D) A POS    Antibody Screen NEG    Sample Expiration      10/18/2019,2359 Performed at [facility], [address] [place], [place] [address]   CBC     Status: Abnormal   Collection Time: 10/15/19  4:59 AM  Result Value Ref Range   WBC 8.7 4.0 - 10.5 K/uL   RBC 3.22 (L) 3.87 - 5.11 MIL/uL   Hemoglobin 9.7 (L) 12.0 - 15.0 g/dL   HCT 19.1 (L) 36 - 46 %   MCV 88.8 80.0 - 100.0 fL   MCH 30.1 26.0 - 34.0 pg   MCHC 33.9 30.0 - 36.0 g/dL   RDW 47.8 29.5 - 62.1 %   Platelets 119 (L) 150 - 400 K/uL   nRBC 0.2 0.0 - 0.2 %  Comprehensive metabolic panel     Status: Abnormal   Collection Time: 10/15/19  4:59 AM  Result Value Ref Range   Sodium 138 135 - 145 mmol/L   Potassium 3.8 3.5 - 5.1 mmol/L   Chloride 110 98 - 111 mmol/L   CO2 19 (L) 22 - 32 mmol/L   Glucose, Bld 76 70 - 99 mg/dL   BUN 17 6 - 20 mg/dL   Creatinine, Ser 3.08 (H) 0.44 - 1.00 mg/dL   Calcium 8.6 (L) 8.9 - 10.3 mg/dL   Total Protein 4.3 (L) 6.5 - 8.1 g/dL   Albumin 2.1 (L) 3.5 - 5.0 g/dL   AST 40 15 - 41 U/L   ALT 34 0 - 44 U/L   Alkaline Phosphatase 95 38 - 126 U/L   Total Bilirubin 0.4 0.3 - 1.2 mg/dL   GFR calc non Af Amer >60 >60 mL/min   Anion gap 9 5 - 15  Group B strep by PCR     Status: None   Collection Time: 10/15/19 12:42 PM   Specimen: Vaginal/Rectal; Genital  Result Value Ref Range   Group B strep by PCR NEGATIVE NEGATIVE  CBC     Status: Abnormal   Collection Time: 10/15/19 12:45 PM  Result Value Ref Range   WBC 9.9 4.0 - 10.5 K/uL   RBC 3.35 (L) 3.87 - 5.11 MIL/uL   Hemoglobin 9.9 (L) 12.0 - 15.0 g/dL   HCT 65.7 (L) 36 - 46 %   MCV 88.4 80.0 -  100.0 fL   MCH 29.6 26.0 - 34.0 pg   MCHC 33.4 30.0 - 36.0 g/dL   RDW 84.6 96.2 - 95.2 %   Platelets 123 (L) 150 - 400 K/uL   nRBC 0.3 (H) 0.0 - 0.2 %  Comprehensive metabolic panel     Status: Abnormal   Collection Time: 10/15/19 12:45 PM  Result Value Ref Range   Sodium 135 135 - 145 mmol/L   Potassium 3.9 3.5 - 5.1 mmol/L   Chloride 106 98 - 111 mmol/L   CO2 18 (L) 22 - 32 mmol/L   Glucose, Bld 79 70 - 99 mg/dL   BUN 17 6 - 20 mg/dL   Creatinine, Ser 8.41 (H) 0.44 - 1.00 mg/dL   Calcium 8.4 (L) 8.9 -  10.3 mg/dL   Total Protein 4.5 (L) 6.5 - 8.1 g/dL   Albumin 2.2 (L) 3.5 - 5.0 g/dL   AST 41 15 - 41 U/L   ALT 32 0 - 44 U/L   Alkaline Phosphatase 100 38 - 126 U/L   Total Bilirubin 0.4 0.3 - 1.2 mg/dL   GFR, Estimated >67 >34 mL/min   Anion gap 11 5 - 15

## 2019-10-15 NOTE — Consult Note (Signed)
[facility]  Prenatal Consult       10/15/2019  5:13 PM   I was asked by Dr. Henderson Cloud to consult on this patient for possible preterm delivery.  I had the pleasure of meeting with Sally Cross today.  She is a 29 y/o G1P0 at 107 and 2/[redacted] weeks gestation who was admitted on 10/4 for preeclampsia.  She received BMZ on 10/4 and 10/5.  She is on magnesium.  Labor is now being induced as she has worsening blood pressure and thrombocytopenia (123k).  She is GBS negative and not yet ruptured.  The fetus is a female to be named "Purcell Nails".    I explained that the neonatal intensive care team would be present for the delivery and outlined the likely delivery room course for this baby including routine resuscitation and NRP-guided approaches to the treatment of respiratory distress. We discussed other common problems associated with prematurity including respiratory distress syndrome, feeding issues, temperature regulation, and infection risk.     We discussed the average length of stay but I noted that the actual LOS would depend on the severity of problems encountered and response to treatments.  We discussed visitation policies and the resources available while her child is in the hospital.  We discussed the importance of good nutrition and various methods of providing nutrition (parenteral hyperalimentation, gavage feedings and/or oral feeding). We discussed the benefits of human milk. I encouraged breast feeding and pumping soon after birth and outlined resources that are available to support breast feeding. She does intend to breastfeed her infant.    Thank you for involving [place] in the care of this patient. A member of our team will be available should the family have additional questions.  Time for consultation approximately 30 minutes.   _____________________ Electronically Signed By: Maryan Char, MD Neonatologist

## 2019-10-15 NOTE — Anesthesia Preprocedure Evaluation (Signed)
Anesthesia Evaluation  Patient identified by MRN, date of birth, ID band Patient awake    Reviewed: Allergy & Precautions, NPO status , Patient's Chart, lab work & pertinent test results, reviewed documented beta blocker date and time   Airway Mallampati: I       Dental   Pulmonary neg pulmonary ROS,    Pulmonary exam normal        Cardiovascular hypertension, Pt. on home beta blockers Normal cardiovascular exam     Neuro/Psych negative neurological ROS  negative psych ROS   GI/Hepatic negative GI ROS, Neg liver ROS,   Endo/Other  Hypothyroidism   Renal/GU negative Renal ROS  negative genitourinary   Musculoskeletal negative musculoskeletal ROS (+)   Abdominal Normal abdominal exam  (+)   Peds  Hematology  (+) Blood dyscrasia, anemia ,   Anesthesia Other Findings   Reproductive/Obstetrics (+) Pregnancy                             Anesthesia Physical Anesthesia Plan  ASA: III  Anesthesia Plan: Epidural   Post-op Pain Management:    Induction:   PONV Risk Score and Plan:   Airway Management Planned:   Additional Equipment: None  Intra-op Plan:   Post-operative Plan:   Informed Consent: I have reviewed the patients History and Physical, chart, labs and discussed the procedure including the risks, benefits and alternatives for the proposed anesthesia with the patient or authorized representative who has indicated his/her understanding and acceptance.       Plan Discussed with:   Anesthesia Plan Comments:         Anesthesia Quick Evaluation

## 2019-10-15 NOTE — Progress Notes (Signed)
HA this morning still lingers No vision change, no abdominal pain "I feel bad"  Today's Vitals   10/15/19 1137 10/15/19 1140 10/15/19 1147 10/15/19 1157  BP: (!) 160/100  (!) 150/95 (!) 143/92  Pulse: 75  84 87  Resp: 19   17  Temp:    98 F (36.7 C)  TempSrc:    Oral  SpO2:  95% 94% 94%  Weight:      Height:      PainSc:       Body mass index is 29.21 kg/m.   She required IV labetalol x multiple doses this am and x 1 since arrival to L&D  Abdomen-no epigastric tenderness DTR 3+ without clonus  FHT cat ne UC irregular  BSUS>VTX Cx 2/60/-1/vtx  Results for orders placed or performed during the hospital encounter of 10/11/19 (from the past 24 hour(s))  Type and screen [facility]     Status: None   Collection Time: 10/15/19  4:59 AM  Result Value Ref Range   ABO/RH(D) A POS    Antibody Screen NEG    Sample Expiration      10/18/2019,2359 Performed at [facility], [address] [place], [place] [address]   CBC     Status: Abnormal   Collection Time: 10/15/19  4:59 AM  Result Value Ref Range   WBC 8.7 4.0 - 10.5 K/uL   RBC 3.22 (L) 3.87 - 5.11 MIL/uL   Hemoglobin 9.7 (L) 12.0 - 15.0 g/dL   HCT 75.1 (L) 36 - 46 %   MCV 88.8 80.0 - 100.0 fL   MCH 30.1 26.0 - 34.0 pg   MCHC 33.9 30.0 - 36.0 g/dL   RDW 02.5 85.2 - 77.8 %   Platelets 119 (L) 150 - 400 K/uL   nRBC 0.2 0.0 - 0.2 %  Comprehensive metabolic panel     Status: Abnormal   Collection Time: 10/15/19  4:59 AM  Result Value Ref Range   Sodium 138 135 - 145 mmol/L   Potassium 3.8 3.5 - 5.1 mmol/L   Chloride 110 98 - 111 mmol/L   CO2 19 (L) 22 - 32 mmol/L   Glucose, Bld 76 70 - 99 mg/dL   BUN 17 6 - 20 mg/dL   Creatinine, Ser 2.42 (H) 0.44 - 1.00 mg/dL   Calcium 8.6 (L) 8.9 - 10.3 mg/dL   Total Protein 4.3 (L) 6.5 - 8.1 g/dL   Albumin 2.1 (L) 3.5 - 5.0 g/dL   AST 40 15 - 41 U/L   ALT 34 0 - 44 U/L   Alkaline Phosphatase 95 38 - 126 U/L   Total Bilirubin 0.4 0.3 - 1.2 mg/dL    GFR calc non Af Amer >60 >60 mL/min   Anion gap 9 5 - 15   A/P: Severe preeclampsia with HA, worsening BP and thrombocytopenia         I recommend delivery         Cx is favorable. D/W IOL with pitocin and risks with possible C/S. D/W magnesium sulfate. Repeat labs

## 2019-10-16 LAB — CBC
HCT: 26.8 % — ABNORMAL LOW (ref 36.0–46.0)
HCT: 26.9 % — ABNORMAL LOW (ref 36.0–46.0)
HCT: 27.8 % — ABNORMAL LOW (ref 36.0–46.0)
HCT: 29.5 % — ABNORMAL LOW (ref 36.0–46.0)
Hemoglobin: 8.6 g/dL — ABNORMAL LOW (ref 12.0–15.0)
Hemoglobin: 8.8 g/dL — ABNORMAL LOW (ref 12.0–15.0)
Hemoglobin: 9.2 g/dL — ABNORMAL LOW (ref 12.0–15.0)
Hemoglobin: 9.7 g/dL — ABNORMAL LOW (ref 12.0–15.0)
MCH: 28.6 pg (ref 26.0–34.0)
MCH: 29.3 pg (ref 26.0–34.0)
MCH: 29.4 pg (ref 26.0–34.0)
MCH: 29.6 pg (ref 26.0–34.0)
MCHC: 32 g/dL (ref 30.0–36.0)
MCHC: 32.8 g/dL (ref 30.0–36.0)
MCHC: 32.9 g/dL (ref 30.0–36.0)
MCHC: 33.1 g/dL (ref 30.0–36.0)
MCV: 89.3 fL (ref 80.0–100.0)
MCV: 89.4 fL (ref 80.0–100.0)
MCV: 89.4 fL (ref 80.0–100.0)
MCV: 89.4 fL (ref 80.0–100.0)
Platelets: 124 10*3/uL — ABNORMAL LOW (ref 150–400)
Platelets: 125 10*3/uL — ABNORMAL LOW (ref 150–400)
Platelets: 127 10*3/uL — ABNORMAL LOW (ref 150–400)
Platelets: 128 10*3/uL — ABNORMAL LOW (ref 150–400)
RBC: 3 MIL/uL — ABNORMAL LOW (ref 3.87–5.11)
RBC: 3.01 MIL/uL — ABNORMAL LOW (ref 3.87–5.11)
RBC: 3.11 MIL/uL — ABNORMAL LOW (ref 3.87–5.11)
RBC: 3.3 MIL/uL — ABNORMAL LOW (ref 3.87–5.11)
RDW: 12.5 % (ref 11.5–15.5)
RDW: 12.5 % (ref 11.5–15.5)
RDW: 12.5 % (ref 11.5–15.5)
RDW: 12.6 % (ref 11.5–15.5)
WBC: 10.8 10*3/uL — ABNORMAL HIGH (ref 4.0–10.5)
WBC: 9.7 10*3/uL (ref 4.0–10.5)
WBC: 9.8 10*3/uL (ref 4.0–10.5)
WBC: 9.9 10*3/uL (ref 4.0–10.5)
nRBC: 0 % (ref 0.0–0.2)
nRBC: 0 % (ref 0.0–0.2)
nRBC: 0 % (ref 0.0–0.2)
nRBC: 0 % (ref 0.0–0.2)

## 2019-10-16 LAB — COMPREHENSIVE METABOLIC PANEL
ALT: 31 U/L (ref 0–44)
ALT: 30 U/L (ref 0–44)
ALT: 27 U/L (ref 0–44)
AST: 38 U/L (ref 15–41)
AST: 35 U/L (ref 15–41)
AST: 31 U/L (ref 15–41)
Albumin: 1.9 g/dL — ABNORMAL LOW (ref 3.5–5.0)
Albumin: 2 g/dL — ABNORMAL LOW (ref 3.5–5.0)
Albumin: 1.8 g/dL — ABNORMAL LOW (ref 3.5–5.0)
Alkaline Phosphatase: 90 U/L (ref 38–126)
Alkaline Phosphatase: 95 U/L (ref 38–126)
Alkaline Phosphatase: 86 U/L (ref 38–126)
Anion gap: 8 (ref 5–15)
Anion gap: 9 (ref 5–15)
Anion gap: 8 (ref 5–15)
BUN: 18 mg/dL (ref 6–20)
BUN: 19 mg/dL (ref 6–20)
BUN: 20 mg/dL (ref 6–20)
CO2: 20 mmol/L — ABNORMAL LOW (ref 22–32)
CO2: 20 mmol/L — ABNORMAL LOW (ref 22–32)
CO2: 21 mmol/L — ABNORMAL LOW (ref 22–32)
Calcium: 7.4 mg/dL — ABNORMAL LOW (ref 8.9–10.3)
Calcium: 7.3 mg/dL — ABNORMAL LOW (ref 8.9–10.3)
Calcium: 7.1 mg/dL — ABNORMAL LOW (ref 8.9–10.3)
Chloride: 104 mmol/L (ref 98–111)
Chloride: 104 mmol/L (ref 98–111)
Chloride: 105 mmol/L (ref 98–111)
Creatinine, Ser: 1.15 mg/dL — ABNORMAL HIGH (ref 0.44–1.00)
Creatinine, Ser: 1.38 mg/dL — ABNORMAL HIGH (ref 0.44–1.00)
Creatinine, Ser: 1.26 mg/dL — ABNORMAL HIGH (ref 0.44–1.00)
GFR, Estimated: >60 mL/min (ref >60)
GFR, Estimated: 52 mL/min — ABNORMAL LOW (ref >60)
GFR, Estimated: 58 mL/min — ABNORMAL LOW (ref >60)
Glucose, Bld: 96 mg/dL (ref 70–99)
Glucose, Bld: 107 mg/dL — ABNORMAL HIGH (ref 70–99)
Glucose, Bld: 105 mg/dL — ABNORMAL HIGH (ref 70–99)
Potassium: 4.1 mmol/L (ref 3.5–5.1)
Potassium: 4.4 mmol/L (ref 3.5–5.1)
Potassium: 4.5 mmol/L (ref 3.5–5.1)
Sodium: 132 mmol/L — ABNORMAL LOW (ref 135–145)
Sodium: 133 mmol/L — ABNORMAL LOW (ref 135–145)
Sodium: 134 mmol/L — ABNORMAL LOW (ref 135–145)
Total Bilirubin: 0.3 mg/dL (ref 0.3–1.2)
Total Bilirubin: 0.2 mg/dL — ABNORMAL LOW (ref 0.3–1.2)
Total Bilirubin: 0.2 mg/dL — ABNORMAL LOW (ref 0.3–1.2)
Total Protein: 4.2 g/dL — ABNORMAL LOW (ref 6.5–8.1)
Total Protein: 4 g/dL — ABNORMAL LOW (ref 6.5–8.1)
Total Protein: 4 g/dL — ABNORMAL LOW (ref 6.5–8.1)

## 2019-10-16 LAB — RPR: RPR Ser Ql: NONREACTIVE

## 2019-10-16 LAB — MAGNESIUM
Magnesium: 7.3 mg/dL (ref 1.7–2.4)
Magnesium: 6.9 mg/dL (ref 1.7–2.4)
Magnesium: 6.3 mg/dL (ref 1.7–2.4)

## 2019-10-16 MED ORDER — LEVOTHYROXINE SODIUM 25 MCG PO TABS
25.0000 ug | ORAL_TABLET | Freq: Every day | ORAL | Status: DC
  Administered 2019-10-16 – 2019-10-18 (×3): 25 ug via ORAL
  Filled 2019-10-16 (×2): qty 1

## 2019-10-16 MED ORDER — ONDANSETRON HCL 4 MG PO TABS: 4.0000 mg | ORAL_TABLET | ORAL | Status: DC | PRN

## 2019-10-16 MED ORDER — ONDANSETRON HCL 4 MG/2ML IJ SOLN: 4.0000 mg | INTRAMUSCULAR | Status: DC | PRN

## 2019-10-16 MED ORDER — WITCH HAZEL-GLYCERIN EX PADS
1.0000 "application " | MEDICATED_PAD | CUTANEOUS | Status: DC | PRN
  Administered 2019-10-16: 1 via TOPICAL

## 2019-10-16 MED ORDER — DIPHENHYDRAMINE HCL 25 MG PO CAPS: 25.0000 mg | ORAL_CAPSULE | Freq: Four times a day (QID) | ORAL | Status: DC | PRN

## 2019-10-16 MED ORDER — COCONUT OIL OIL
1.0000 "application " | TOPICAL_OIL | Status: DC | PRN
  Administered 2019-10-16: 1 via TOPICAL

## 2019-10-16 MED ORDER — DIBUCAINE (PERIANAL) 1 % EX OINT: 1.0000 "application " | TOPICAL_OINTMENT | CUTANEOUS | Status: DC | PRN

## 2019-10-16 MED ORDER — IBUPROFEN 600 MG PO TABS
600.0000 mg | ORAL_TABLET | Freq: Four times a day (QID) | ORAL | Status: DC
  Administered 2019-10-16 (×2): 600 mg via ORAL
  Filled 2019-10-16 (×2): qty 1

## 2019-10-16 MED ORDER — BENZOCAINE-MENTHOL 20-0.5 % EX AERO
1.0000 "application " | INHALATION_SPRAY | CUTANEOUS | Status: DC | PRN
  Administered 2019-10-16: 1 via TOPICAL
  Filled 2019-10-16: qty 56

## 2019-10-16 MED ORDER — SIMETHICONE 80 MG PO CHEW: 80.0000 mg | CHEWABLE_TABLET | ORAL | Status: DC | PRN

## 2019-10-16 MED ORDER — OXYCODONE HCL 5 MG PO TABS: 5.0000 mg | ORAL_TABLET | ORAL | Status: DC | PRN

## 2019-10-16 MED ORDER — ACETAMINOPHEN 325 MG PO TABS: 650.0000 mg | ORAL_TABLET | ORAL | Status: DC | PRN

## 2019-10-16 MED ORDER — LACTATED RINGERS IV SOLN
INTRAVENOUS | Status: AC
  Administered 2019-10-16: 75 mL/h via INTRAVENOUS

## 2019-10-16 MED ORDER — AMMONIA AROMATIC IN INHA
RESPIRATORY_TRACT | Status: AC
  Filled 2019-10-16: qty 10

## 2019-10-16 MED ORDER — TETANUS-DIPHTH-ACELL PERTUSSIS 5-2.5-18.5 LF-MCG/0.5 IM SUSP: 0.5000 mL | Freq: Once | INTRAMUSCULAR | Status: DC

## 2019-10-16 MED ORDER — LABETALOL HCL 200 MG PO TABS
400.0000 mg | ORAL_TABLET | Freq: Three times a day (TID) | ORAL | Status: DC
  Administered 2019-10-16 – 2019-10-18 (×8): 400 mg via ORAL
  Filled 2019-10-16 (×9): qty 2

## 2019-10-16 MED ORDER — OXYCODONE HCL 5 MG PO TABS: 10.0000 mg | ORAL_TABLET | ORAL | Status: DC | PRN

## 2019-10-16 MED ORDER — PRENATAL MULTIVITAMIN CH
1.0000 | ORAL_TABLET | Freq: Every day | ORAL | Status: DC
  Administered 2019-10-16 – 2019-10-18 (×3): 1 via ORAL
  Filled 2019-10-16 (×3): qty 1

## 2019-10-16 MED ORDER — MAGNESIUM SULFATE 40 GM/1000ML IV SOLN
1.0000 g/h | INTRAVENOUS | Status: AC
  Filled 2019-10-16: qty 1000

## 2019-10-16 MED ORDER — ZOLPIDEM TARTRATE 5 MG PO TABS: 5.0000 mg | ORAL_TABLET | Freq: Every evening | ORAL | Status: DC | PRN

## 2019-10-16 MED ORDER — SENNOSIDES-DOCUSATE SODIUM 8.6-50 MG PO TABS
2.0000 | ORAL_TABLET | ORAL | Status: DC
  Administered 2019-10-16 – 2019-10-18 (×3): 2 via ORAL
  Filled 2019-10-16 (×3): qty 2

## 2019-10-16 NOTE — Progress Notes (Signed)
Results for Sally Cross, Sally Cross (MRN [id number]) as of 10/16/2019 05:47  Ref. Range 10/16/2019 04:52  Magnesium Latest Ref Range: 1.7 - 2.4 mg/dL 7.3 (HH)   Dr Henderson Cloud notified.  Order received. Magnesium decreased to 1 gm/h

## 2019-10-16 NOTE — Progress Notes (Signed)
Patient scan with medtronic, no sponges seen.

## 2019-10-16 NOTE — Anesthesia Postprocedure Evaluation (Signed)
Anesthesia Post Note  Patient: Minela Bridgewater  Procedure(s) Performed: AN AD HOC LABOR EPIDURAL     Patient location during evaluation: OB High Risk Anesthesia Type: Epidural Level of consciousness: awake and alert Pain management: pain level controlled Vital Signs Assessment: post-procedure vital signs reviewed and stable Respiratory status: spontaneous breathing Cardiovascular status: stable Postop Assessment: no headache, adequate PO intake, no backache, patient able to bend at knees, able to ambulate, epidural receding and no apparent nausea or vomiting Anesthetic complications: no   No complications documented.  Last Vitals:  Vitals:   10/16/19 0630 10/16/19 0744  BP:  130/84  Pulse:  72  Resp: 18 18  Temp:  36.4 C  SpO2: 94% 94%    Last Pain:  Vitals:   10/16/19 0744  TempSrc: Oral  PainSc:    Pain Goal: Patients Stated Pain Goal: 3 (10/16/19 0432)                 Salome Arnt

## 2019-10-16 NOTE — Progress Notes (Signed)
CRITICAL VALUE STICKER  CRITICAL VALUE: Magnesium 6.3  RECEIVER (on-site recipient of call): A. Victoriah Wilds RN  DATE & TIME NOTIFIED: 10/16/2019 at 1800  MESSENGER (representative from lab): LAb  MD NOTIFIED: yes, [place]  TIME OF NOTIFICATION: 1815  RESPONSE: Continue to monitor, no new orders

## 2019-10-16 NOTE — Progress Notes (Signed)
Patient reports dizziness when sitting up. Patient taken to NICU on stretcher to see infant.

## 2019-10-16 NOTE — Lactation Note (Signed)
This note was copied from a baby's chart. Lactation Consultation Note  Patient Name: Girl Wylene Weissman [id number] Date: 10/16/2019 Reason for consult: Initial assessment;Primapara;1st time breastfeeding;NICU baby;Infant < 6lbs;Preterm <34wks  Mom on MgSO4 for GHTN  LC in to visit with P1 Mom and FOB of [redacted]w[redacted]d infant weighing 3 lbs 8.1 oz in the NICU.  Baby 12 hrs old and on room air and has PIV.  Mom was recently set up with a DEBP and assisted with first pumping.  Mom states she was shown how to do breast massage and hand expression.  Colostrum containers provided and breast milk labels and numbered sticky dots to identify order of colostrum expressed.  Mom states she has a Medela DEBP at home.   Reviewed how to disassemble pump parts, wash, rinse and air dry in separate bin provided.    Mom aware of DEBP in baby's room for Mom to use once she is able to visit and after discharge.    Lactation brochure and NICU booklet provided and encouraged Mom to review.  Information about Dr. Erskine Squibb Morton's hand expression video written on dry erase board.  Mom denies any questions currently.  To ask for assistance prn.    Interventions Interventions: Breast feeding basics reviewed;Skin to skin;Breast massage;Hand express;DEBP  Lactation Tools Discussed/Used Tools: Pump Breast pump type: Double-Electric Breast Pump WIC Program: No Pump Review: Setup, frequency, and cleaning;Milk Storage Initiated by:: OBSC RN Date initiated:: 10/16/19   Consult Status Consult Status: Follow-up Date: 10/17/19 Follow-up type: In-patient    [name] 10/16/2019, 11:30 AM

## 2019-10-16 NOTE — Progress Notes (Signed)
No C/O, no HA Was dizzy early this am when she sat up, has not tried again. Baby doing well in NICU-room air  Today's Vitals   10/16/19 0332 10/16/19 0432 10/16/19 0530 10/16/19 0630  BP: 124/74 130/80    Pulse: 77 84    Resp: 18 18 18 18   Temp: 98.2 F (36.8 C) 98.3 F (36.8 C)    TempSrc: Oral Oral    SpO2: 95% 96% 95% 94%  Weight:      Height:      PainSc: 0-No pain 0-No pain 0-No pain 0-No pain   Body mass index is 29.21 kg/m.   UO about 30 ml/hr, dilute  Patient alert, NAD  FFNT DTR 2+  Magnesium sulfate 1 gm/hr  Results for orders placed or performed during the hospital encounter of 10/11/19 (from the past 24 hour(s))  Group B strep by PCR     Status: None   Collection Time: 10/15/19 12:42 PM   Specimen: Vaginal/Rectal; Genital  Result Value Ref Range   Group B strep by PCR NEGATIVE NEGATIVE  CBC     Status: Abnormal   Collection Time: 10/15/19 12:45 PM  Result Value Ref Range   WBC 9.9 4.0 - 10.5 K/uL   RBC 3.35 (L) 3.87 - 5.11 MIL/uL   Hemoglobin 9.9 (L) 12.0 - 15.0 g/dL   HCT 12/15/19 (L) 36 - 46 %   MCV 88.4 80.0 - 100.0 fL   MCH 29.6 26.0 - 34.0 pg   MCHC 33.4 30.0 - 36.0 g/dL   RDW 08.6 76.1 - 95.0 %   Platelets 123 (L) 150 - 400 K/uL   nRBC 0.3 (H) 0.0 - 0.2 %  Comprehensive metabolic panel     Status: Abnormal   Collection Time: 10/15/19 12:45 PM  Result Value Ref Range   Sodium 135 135 - 145 mmol/L   Potassium 3.9 3.5 - 5.1 mmol/L   Chloride 106 98 - 111 mmol/L   CO2 18 (L) 22 - 32 mmol/L   Glucose, Bld 79 70 - 99 mg/dL   BUN 17 6 - 20 mg/dL   Creatinine, Ser 12/15/19 (H) 0.44 - 1.00 mg/dL   Calcium 8.4 (L) 8.9 - 10.3 mg/dL   Total Protein 4.5 (L) 6.5 - 8.1 g/dL   Albumin 2.2 (L) 3.5 - 5.0 g/dL   AST 41 15 - 41 U/L   ALT 32 0 - 44 U/L   Alkaline Phosphatase 100 38 - 126 U/L   Total Bilirubin 0.4 0.3 - 1.2 mg/dL   GFR, Estimated 6.71 >24 mL/min   Anion gap 11 5 - 15  CBC     Status: Abnormal   Collection Time: 10/15/19  5:51 PM  Result Value  Ref Range   WBC 8.8 4.0 - 10.5 K/uL   RBC 3.44 (L) 3.87 - 5.11 MIL/uL   Hemoglobin 9.8 (L) 12.0 - 15.0 g/dL   HCT 12/15/19 (L) 36 - 46 %   MCV 88.1 80.0 - 100.0 fL   MCH 28.5 26.0 - 34.0 pg   MCHC 32.3 30.0 - 36.0 g/dL   RDW 09.9 83.3 - 82.5 %   Platelets 122 (L) 150 - 400 K/uL   nRBC 0.0 0.0 - 0.2 %  CBC     Status: Abnormal   Collection Time: 10/15/19 11:49 PM  Result Value Ref Range   WBC 9.9 4.0 - 10.5 K/uL   RBC 3.30 (L) 3.87 - 5.11 MIL/uL   Hemoglobin 9.7 (L) 12.0 - 15.0 g/dL  HCT 29.5 (L) 36 - 46 %   MCV 89.4 80.0 - 100.0 fL   MCH 29.4 26.0 - 34.0 pg   MCHC 32.9 30.0 - 36.0 g/dL   RDW 38.1 01.7 - 51.0 %   Platelets 127 (L) 150 - 400 K/uL   nRBC 0.0 0.0 - 0.2 %  Comprehensive metabolic panel     Status: Abnormal   Collection Time: 10/16/19  4:52 AM  Result Value Ref Range   Sodium 132 (L) 135 - 145 mmol/L   Potassium 4.1 3.5 - 5.1 mmol/L   Chloride 104 98 - 111 mmol/L   CO2 20 (L) 22 - 32 mmol/L   Glucose, Bld 96 70 - 99 mg/dL   BUN 18 6 - 20 mg/dL   Creatinine, Ser 2.58 (H) 0.44 - 1.00 mg/dL   Calcium 7.4 (L) 8.9 - 10.3 mg/dL   Total Protein 4.2 (L) 6.5 - 8.1 g/dL   Albumin 1.9 (L) 3.5 - 5.0 g/dL   AST 38 15 - 41 U/L   ALT 31 0 - 44 U/L   Alkaline Phosphatase 90 38 - 126 U/L   Total Bilirubin 0.3 0.3 - 1.2 mg/dL   GFR, Estimated >52 >77 mL/min   Anion gap 8 5 - 15  Magnesium     Status: Abnormal   Collection Time: 10/16/19  4:52 AM  Result Value Ref Range   Magnesium 7.3 (HH) 1.7 - 2.4 mg/dL  CBC     Status: Abnormal   Collection Time: 10/16/19  4:52 AM  Result Value Ref Range   WBC 10.8 (H) 4.0 - 10.5 K/uL   RBC 3.11 (L) 3.87 - 5.11 MIL/uL   Hemoglobin 9.2 (L) 12.0 - 15.0 g/dL   HCT 82.4 (L) 36 - 46 %   MCV 89.4 80.0 - 100.0 fL   MCH 29.6 26.0 - 34.0 pg   MCHC 33.1 30.0 - 36.0 g/dL   RDW 23.5 36.1 - 44.3 %   Platelets 128 (L) 150 - 400 K/uL   nRBC 0.0 0.0 - 0.2 %   A/P: Preeclampsia         UO borderline but adequate         Creatinine up          Will check labs @ 1100         Continue magnesium sulfate at 1 gm/hr         Labetalol 400mg  q8 hours-will watch BP and adjust as needed         D/W patient and husband

## 2019-10-17 MED ORDER — NIFEDIPINE ER OSMOTIC RELEASE 30 MG PO TB24
30.0000 mg | ORAL_TABLET | Freq: Every day | ORAL | Status: DC
  Administered 2019-10-17: 30 mg via ORAL
  Filled 2019-10-17: qty 1

## 2019-10-17 NOTE — Lactation Note (Signed)
This note was copied from a baby's chart. Lactation Consultation Note  Patient Name: Sally Cross Date: 10/17/2019    Consult Status   LC to room for f/u consult. Pt was not in room. Will attempt f/u in NICU. No charge.   Elder Negus 10/17/2019, 3:05 PM

## 2019-10-17 NOTE — Lactation Note (Signed)
This note was copied from a baby's chart. Lactation Consultation Note  Patient Name: Sally Cross [id number] Date: 10/17/2019 Reason for consult: Follow-up assessment;Primapara;1st time breastfeeding;NICU baby;Infant < 6lbs;Preterm <34wks   AGA [redacted]w[redacted]d baby 43 hrs old.  LC visited with P1 Mom and FOB in baby's room in the NICU.  Mom states she hasn't had any difficulty with pumping and has transported colostrum to NICU for oral care.    Parents at bedside talking with RN.   LC informed Mom that we will visit with her tomorrow.   Mom will be discharged tomorrow and plans to come stay in baby's room.  Mom aware of Medela Symphony pump available to her.   Interventions Interventions: Breast feeding basics reviewed;Skin to skin;Breast massage;Hand express;DEBP  Lactation Tools Discussed/Used Tools: Pump Breast pump type: Double-Electric Breast Pump   Consult Status Consult Status: Follow-up Date: 10/18/19 Follow-up type: In-patient    Sally Cross 10/17/2019, 6:09 PM

## 2019-10-17 NOTE — Progress Notes (Signed)
Post Partum Day 2 Subjective: no complaints, up ad lib and tolerating PO No HA  Objective: Blood pressure (!) 145/91, pulse 77, temperature 98.1 F (36.7 C), temperature source Oral, resp. rate 18, height 5\' 7"  (1.702 m), weight 84.6 kg, SpO2 96 %, unknown if currently breastfeeding.  Physical Exam:  General: alert, cooperative and no distress Lochia: appropriate Uterine Fundus: firm Incision: healing well DVT Evaluation: No evidence of DVT seen on physical exam.  Recent Labs    10/16/19 1103 10/16/19 1654  HGB 8.6* 8.8*  HCT 26.9* 26.8*   UO improved last pm and creatinine going down Magnesium sulfate discontinued 11:00 pm  Labetalol 400mg  TID  Assessment/Plan: BP remains labile Will start nifedipine XL 30mg   QD Continue labetalol as above D/W patient probable transition to monotherapy with nifedipine Observe   LOS: 5 days   12/16/19 II 10/17/2019, 8:36 AM

## 2019-10-18 MED ORDER — NIFEDIPINE ER OSMOTIC RELEASE 30 MG PO TB24
60.0000 mg | ORAL_TABLET | Freq: Every day | ORAL | Status: DC
  Administered 2019-10-18: 60 mg via ORAL
  Filled 2019-10-18: qty 2

## 2019-10-18 MED ORDER — LABETALOL HCL 200 MG PO TABS
400.0000 mg | ORAL_TABLET | Freq: Three times a day (TID) | ORAL | 1 refills | Status: DC
Start: 2019-10-18 — End: 2022-10-01

## 2019-10-18 MED ORDER — NIFEDIPINE ER 60 MG PO TB24
60.0000 mg | ORAL_TABLET | Freq: Every day | ORAL | 1 refills | Status: DC
Start: 2019-10-19 — End: 2022-10-01

## 2019-10-18 NOTE — Lactation Note (Signed)
This note was copied from a baby's chart. Lactation Consultation Note  Patient Name: Sally Cross [id number] Date: 10/18/2019 Reason for consult: Follow-up assessment;NICU baby;Preterm <34wks  1108 - I followed up with Sally Cross. She was preparing to see her daughter in the NICU. We discussed her progress with breast pumping, and she stated that she was pumping approximately every 2-3 hours. She missed her pump session in the middle of the night (early am) due to exhaustion. I praised her for her hard work and consistency, and I encouraged her to try to pump at night when she is physically able. She verbalized understanding.  Sally Cross states that she is still expressing colostrum; she is able to provide some EBM to baby in the NICU. I educated her on when to expect her mature milk tor transition and provided reassurance that at 61 hours postpartum, it may not transition for another day or so.  Sally Cross acknowledges that she has all needed supplies for breast pumping at this time.  Plan: Breast pump every 2-3 hours during the day and every 3-4 hours at night. Switch pump mode to maintain when she expresses 20+ mls (combined) 3 or more times in a row.  Maternal Data Formula Feeding for Exclusion: No Does the patient have breastfeeding experience prior to this delivery?: No  Feeding Feeding Type: Donor Breast Milk   Interventions Interventions: Breast feeding basics reviewed  Lactation Tools Discussed/Used Pump Review: Setup, frequency, and cleaning   Consult Status Consult Status: Follow-up Date: 10/19/19 Follow-up type: In-patient    Sally Cross 10/18/2019, 11:40 AM

## 2019-10-18 NOTE — Progress Notes (Signed)
Post Partum Day 3 s/p SVD after IOL for PIH with severe features at 33 wga Subjective: no complaints, up ad lib, voiding and tolerating PO  Objective: Blood pressure (!) 140/93, pulse 75, temperature 98.2 F (36.8 C), temperature source Oral, resp. rate 18, height 5\' 7"  (1.702 m), weight 84.6 kg, SpO2 98 %, unknown if currently breastfeeding.  Physical Exam:  General: alert Lochia: appropriate Uterine Fundus: firm Incision: n/a DVT Evaluation: No evidence of DVT seen on physical exam.  Recent Labs    10/16/19 1103 10/16/19 1654  HGB 8.6* 8.8*  HCT 26.9* 26.8*    Assessment/Plan: PPD3 s/p SVD c/b PIH with severe features.  Bps improving but still labile S/p Mag x 24 hours CRT was slightly increased to 1.38 and was downtrending yesterday. Now off Mag with excellent diuresis. Repeat prn s/s.  Continue Labetalol 400mg  TID Increase procardia to 60mg  XL today. May consider d/c later today is BP better controlled Baby in NICU doing well.    LOS: 6 days   12/16/19 10/18/2019, 9:33 AM

## 2019-10-18 NOTE — Progress Notes (Signed)
Patient screened out for psychosocial assessment since none of the following apply:  Psychosocial stressors documented in mother or baby's chart  Gestation less than 32 weeks  Code at delivery   Infant with anomalies Please contact the Clinical Social Worker if specific needs arise, by MOB's request, or if MOB scores greater than 9/yes to question 10 on Edinburgh Postpartum Depression Screen.  Kreg Earhart, LCSW Clinical Social Worker [facility] Cell#: [phone]     

## 2019-10-18 NOTE — Progress Notes (Signed)
Last Bps 130s/80s Will d/c home with precautions and strict fu in office in two days Pharmacy to give BP meds and give further counseling Pt reliable

## 2019-10-18 NOTE — Discharge Summary (Signed)
Postpartum Discharge Summary  Date of Service updated 10/18/19     Patient Name: Sally Cross DOB: [birth year] MRN: [id number]  Date of admission: 10/11/2019 Delivery date:10/15/2019  Delivering provider: Everlene Farrier  Date of discharge: 10/18/2019  Admitting diagnosis: Gestational hypertension [O13.9] Severe pre-eclampsia [O14.10] Intrauterine pregnancy: [redacted]w[redacted]d     Secondary diagnosis:  Active Problems:   Gestational hypertension   Severe pre-eclampsia  Additional problems: pre-eclampsia with severe features    Discharge diagnosis: Preeclampsia (severe)                                              Post partum procedures:mag  24 hours pp Augmentation: AROM, Pitocin and Cytotec Complications: None  Hospital course: Induction of Labor With Vaginal Delivery   29 y.o. yo G1P0101 at [redacted]w[redacted]d was admitted to the hospital 10/11/2019 for induction of labor.  Indication for induction: Preeclampsia and at 77 wga.  Patient had an uncomplicated labor course as follows: Membrane Rupture Time/Date: 6:55 PM ,10/15/2019   Delivery Method:Vaginal, Spontaneous  Episiotomy: None;Median  Lacerations:  2nd degree  Details of delivery can be found in separate delivery note.  Patient had a routine postpartum course. Patient is discharged home 10/18/19.  Newborn Data: Birth date:10/15/2019  Birth time:10:40 PM  Gender:Female  Living status:Living  Apgars:8 ,9  Weight:1590 g   Magnesium Sulfate received: Yes: Seizure prophylaxis BMZ received: Yes Rhophylac:N/A MMR:N/A T-DaP:Given prenatally Flu: N/A Transfusion:No  Physical exam  Vitals:   10/18/19 0310 10/18/19 0551 10/18/19 0753 10/18/19 1403  BP: (!) 154/95 138/89 (!) 140/93 (!) 141/82  Pulse: 80 71 75 86  Resp: $Remo'18 18 18 18  'fhyqN$ Temp: 98.4 F (36.9 C)  98.2 F (36.8 C) 98.2 F (36.8 C)  TempSrc: Oral  Oral Oral  SpO2: 97% 99% 98% 99%  Weight:      Height:       General: alert Lochia: appropriate Uterine Fundus: firm Incision:  N/A DVT Evaluation: No evidence of DVT seen on physical exam. Labs: Lab Results  Component Value Date   WBC 9.8 10/16/2019   HGB 8.8 (L) 10/16/2019   HCT 26.8 (L) 10/16/2019   MCV 89.3 10/16/2019   PLT 125 (L) 10/16/2019   CMP Latest Ref Rng & Units 10/16/2019  Glucose 70 - 99 mg/dL 105(H)  BUN 6 - 20 mg/dL 20  Creatinine 0.44 - 1.00 mg/dL 1.26(H)  Sodium 135 - 145 mmol/L 134(L)  Potassium 3.5 - 5.1 mmol/L 4.5  Chloride 98 - 111 mmol/L 105  CO2 22 - 32 mmol/L 21(L)  Calcium 8.9 - 10.3 mg/dL 7.1(L)  Total Protein 6.5 - 8.1 g/dL 4.0(L)  Total Bilirubin 0.3 - 1.2 mg/dL 0.2(L)  Alkaline Phos 38 - 126 U/L 86  AST 15 - 41 U/L 31  ALT 0 - 44 U/L 27   Edinburgh Score: Edinburgh Postnatal Depression Scale Screening Tool 10/17/2019  I have been able to laugh and see the funny side of things. 0  I have looked forward with enjoyment to things. 0  I have blamed myself unnecessarily when things went wrong. 0  I have been anxious or worried for no good reason. 0  I have felt scared or panicky for no good reason. 0  Things have been getting on top of me. 0  I have been so unhappy that I have had difficulty sleeping. 0  I have  felt sad or miserable. 0  I have been so unhappy that I have been crying. 0  The thought of harming myself has occurred to me. 0  Edinburgh Postnatal Depression Scale Total 0      After visit meds:  Allergies as of 10/18/2019   No Known Allergies     Medication List    TAKE these medications   cholecalciferol 25 MCG (1000 UNIT) tablet Commonly known as: VITAMIN D3 Take 1,000 Units by mouth daily.   labetalol 200 MG tablet Commonly known as: NORMODYNE Take 2 tablets (400 mg total) by mouth every 8 (eight) hours. What changed:   how much to take  when to take this   levothyroxine 25 MCG tablet Commonly known as: SYNTHROID Take 25 mcg by mouth daily before breakfast.   NIFEdipine 60 MG 24 hr tablet Commonly known as: ADALAT CC Take 1 tablet (60  mg total) by mouth daily. Start taking on: October 19, 2019   prenatal multivitamin Tabs tablet Take 1 tablet by mouth daily at 12 noon.   vitamin B-12 500 MCG tablet Commonly known as: CYANOCOBALAMIN Take 500 mcg by mouth daily.        Discharge home in stable condition Infant Feeding: Breast Infant Disposition:NICU Discharge instruction: per After Visit Summary and Postpartum booklet. Activity: Advance as tolerated. Pelvic rest for 6 weeks.  Diet: routine diet Anticipated Birth Control: Unsure Postpartum Appointment:2-3 days Additional Postpartum F/U: BP check 2-3 days Future Appointments:No future appointments. Follow up Visit:      10/18/2019 Tyson Dense, MD

## 2019-10-19 ENCOUNTER — Encounter (HOSPITAL_COMMUNITY): Payer: BC Managed Care – PPO

## 2019-10-19 LAB — SURGICAL PATHOLOGY

## 2019-10-20 ENCOUNTER — Inpatient Hospital Stay (HOSPITAL_COMMUNITY): Admission: AD | Admit: 2019-10-20 | Payer: BC Managed Care – PPO | Admitting: Obstetrics and Gynecology

## 2019-10-20 ENCOUNTER — Inpatient Hospital Stay (HOSPITAL_COMMUNITY): Payer: BC Managed Care – PPO

## 2019-10-21 ENCOUNTER — Encounter (HOSPITAL_COMMUNITY): Payer: BC Managed Care – PPO

## 2019-10-21 DIAGNOSIS — I10 Essential (primary) hypertension: Secondary | ICD-10-CM | POA: Diagnosis not present

## 2019-10-22 DIAGNOSIS — F4321 Adjustment disorder with depressed mood: Secondary | ICD-10-CM | POA: Diagnosis not present

## 2019-10-25 ENCOUNTER — Ambulatory Visit: Payer: Self-pay

## 2019-10-25 NOTE — Lactation Note (Addendum)
This note was copied from a baby's chart. Lactation Consultation Note  Patient Name: Sally Cross HQION'G Date: 10/25/2019 Reason for consult: Follow-up assessment;Primapara;1st time breastfeeding;NICU baby;Infant < 6lbs;Late-preterm 34-36.6wks  RN requested LC assistance with baby.  Baby is 7 days old, AGA [redacted]w[redacted]d and weight today 3 lbs 12.3 oz (increase of 50 gm from yesterday).  Baby is exclusively being gavage fed, but showing feeding cues consistently.    RN asked Mom to pre-pump as Mom has a large milk supply.  Mom pumping 3 oz per pumping.   Baby initially placed STS, but due to baby flailing her arms and legs , LC decided to swaddle baby to help her flex to midline.  Baby able to attain a deep latch to breast and suck/swallow for 7 mins with pauses. Mom needing guidance on use of breast support and how to control baby's head from ear to ear, hugging her body in closely.  Baby handled to flow from the pumped breast very well, but tired after 7 mins.    Mom is aware of how well baby did.  Reassured her that baby would get stronger and stronger as she grows and matures.  Encouraged continued consistent pumping >8 times per 24 hrs.    Feeding Feeding Type: Breast Fed  LATCH Score Latch: Grasps breast easily, tongue down, lips flanged, rhythmical sucking.  Audible Swallowing: A few with stimulation  Type of Nipple: Everted at rest and after stimulation  Comfort (Breast/Nipple): Soft / non-tender  Hold (Positioning): Assistance needed to correctly position infant at breast and maintain latch.  LATCH Score: 8  Interventions Interventions: Breast feeding basics reviewed;Assisted with latch;Skin to skin;Breast massage;Hand express;Pre-pump if needed;Breast compression;Adjust position;Support pillows;Position options;Expressed milk;DEBP  Lactation Tools Discussed/Used Tools: Pump Breast pump type: Double-Electric Breast Pump   Consult Status Consult Status:  Follow-up Date: 10/26/19 Follow-up type: In-patient    [name] 10/25/2019, 3:32 PM

## 2019-11-04 ENCOUNTER — Ambulatory Visit: Payer: Self-pay

## 2019-11-04 NOTE — Lactation Note (Signed)
This note was copied from a baby's chart. Lactation Consultation Note  Patient Name: Sally Cross PQZRA'Q Date: 11/04/2019 Reason for consult: Follow-up assessment;NICU baby;Late-preterm 34-36.6wks LC to room for f/u visit. Mother present and holding sleeping baby. She continues to pump with good volume. Baby receiving 50% po using bottle. Mother previously attempted to bf and prefers to pump/bottle feed at this time. Mother may re-challenge at breast. LC offered support prn. Plan f/u in about 1 week.   Interventions Interventions: Breast feeding basics reviewed;Skin to skin;DEBP   Consult Status Consult Status: Follow-up Date: 11/11/19 Follow-up type: In-patient    Elder Negus 11/04/2019, 11:58 AM

## 2019-11-10 ENCOUNTER — Ambulatory Visit: Payer: Self-pay

## 2019-11-10 NOTE — Lactation Note (Signed)
This note was copied from a baby's chart. Lactation Consultation Note  Patient Name: Sally Cross NOIBB'C Date: 11/10/2019 Reason for consult: Follow-up assessment;NICU baby   LC to room for f/u visit. Mother continues to pump and bottle feed, per her choice. She pumps an adequate volume to meet infant's needs. Mother may re-challenge bf p d/c. She had no questions or concerns at this time.   Consult Status Consult Status: Follow-up Date: 11/11/19 Follow-up type: In-patient    Sally Cross 11/10/2019, 2:20 PM

## 2019-11-19 DIAGNOSIS — F4321 Adjustment disorder with depressed mood: Secondary | ICD-10-CM | POA: Diagnosis not present

## 2019-11-25 DIAGNOSIS — Z1389 Encounter for screening for other disorder: Secondary | ICD-10-CM | POA: Diagnosis not present

## 2019-12-03 DIAGNOSIS — F4321 Adjustment disorder with depressed mood: Secondary | ICD-10-CM | POA: Diagnosis not present

## 2019-12-08 DIAGNOSIS — Z30430 Encounter for insertion of intrauterine contraceptive device: Secondary | ICD-10-CM | POA: Diagnosis not present

## 2022-01-07 NOTE — L&D Delivery Note (Signed)
Delivery Note At 11:44 AM a viable female was delivered via  (Presentation:      ).  APGAR: 9 and 9  weight  pending.   Placenta status: spontaneously  ,  .  Cord:   with the following complications:  none.  Cord pH: not obtained  Anesthesia: Epidural Episiotomy:   Lacerations:  2nd Suture Repair: 3.0 vicryl Est. Blood Loss (mL):  300  Mom to postpartum.  Baby to Couplet care / Skin to Skin.  Jeani Hawking 10/10/2022, 12:00 PM

## 2022-04-02 LAB — OB RESULTS CONSOLE HEPATITIS B SURFACE ANTIGEN: Hepatitis B Surface Ag: NEGATIVE

## 2022-04-02 LAB — OB RESULTS CONSOLE ANTIBODY SCREEN: Antibody Screen: NEGATIVE

## 2022-04-02 LAB — OB RESULTS CONSOLE RPR: RPR: NONREACTIVE

## 2022-04-02 LAB — HEPATITIS C ANTIBODY: HCV Ab: NEGATIVE

## 2022-04-02 LAB — OB RESULTS CONSOLE RUBELLA ANTIBODY, IGM: Rubella: IMMUNE

## 2022-04-02 LAB — OB RESULTS CONSOLE HIV ANTIBODY (ROUTINE TESTING): HIV: NONREACTIVE

## 2022-04-12 LAB — OB RESULTS CONSOLE GC/CHLAMYDIA
Chlamydia: NEGATIVE
Neisseria Gonorrhea: NEGATIVE

## 2022-10-01 ENCOUNTER — Inpatient Hospital Stay (HOSPITAL_COMMUNITY)
Admission: AD | Admit: 2022-10-01 | Discharge: 2022-10-01 | Disposition: A | Discharge status: Home / Self Care | Payer: BC Managed Care – PPO | Source: Ambulatory Visit | Attending: Obstetrics and Gynecology | Admitting: Obstetrics and Gynecology

## 2022-10-01 ENCOUNTER — Encounter (HOSPITAL_COMMUNITY): Payer: Self-pay | Admitting: Obstetrics and Gynecology

## 2022-10-01 DIAGNOSIS — Z8759 Personal history of other complications of pregnancy, childbirth and the puerperium: Secondary | ICD-10-CM

## 2022-10-01 DIAGNOSIS — O1413 Severe pre-eclampsia, third trimester: Secondary | ICD-10-CM | POA: Diagnosis not present

## 2022-10-01 DIAGNOSIS — O26893 Other specified pregnancy related conditions, third trimester: Secondary | ICD-10-CM | POA: Diagnosis present

## 2022-10-01 DIAGNOSIS — O10913 Unspecified pre-existing hypertension complicating pregnancy, third trimester: Secondary | ICD-10-CM | POA: Diagnosis not present

## 2022-10-01 DIAGNOSIS — Z3A35 35 weeks gestation of pregnancy: Secondary | ICD-10-CM

## 2022-10-01 DIAGNOSIS — R030 Elevated blood-pressure reading, without diagnosis of hypertension: Secondary | ICD-10-CM

## 2022-10-01 LAB — COMPREHENSIVE METABOLIC PANEL
ALT: 11 U/L (ref 0–44)
AST: 17 U/L (ref 15–41)
Albumin: 2.5 g/dL — ABNORMAL LOW (ref 3.5–5.0)
Alkaline Phosphatase: 123 U/L (ref 38–126)
Anion gap: 8 (ref 5–15)
BUN: 6 mg/dL (ref 6–20)
CO2: 20 mmol/L — ABNORMAL LOW (ref 22–32)
Calcium: 8.4 mg/dL — ABNORMAL LOW (ref 8.9–10.3)
Chloride: 106 mmol/L (ref 98–111)
Creatinine, Ser: 0.72 mg/dL (ref 0.44–1.00)
GFR, Estimated: >60 mL/min (ref >60)
Glucose, Bld: 88 mg/dL (ref 70–99)
Potassium: 3.4 mmol/L — ABNORMAL LOW (ref 3.5–5.1)
Sodium: 134 mmol/L — ABNORMAL LOW (ref 135–145)
Total Bilirubin: 0.4 mg/dL (ref 0.3–1.2)
Total Protein: 5.2 g/dL — ABNORMAL LOW (ref 6.5–8.1)

## 2022-10-01 LAB — CBC
HCT: 31.7 % — ABNORMAL LOW (ref 36.0–46.0)
Hemoglobin: 10.7 g/dL — ABNORMAL LOW (ref 12.0–15.0)
MCH: 29 pg (ref 26.0–34.0)
MCHC: 33.8 g/dL (ref 30.0–36.0)
MCV: 85.9 fL (ref 80.0–100.0)
Platelets: 140 10*3/uL — ABNORMAL LOW (ref 150–400)
RBC: 3.69 MIL/uL — ABNORMAL LOW (ref 3.87–5.11)
RDW: 13.6 % (ref 11.5–15.5)
WBC: 7.3 10*3/uL (ref 4.0–10.5)
nRBC: 0 % (ref 0.0–0.2)

## 2022-10-01 LAB — PROTEIN / CREATININE RATIO, URINE
Creatinine, Urine: 31 mg/dL
Total Protein, Urine: <6 mg/dL

## 2022-10-01 NOTE — MAU Note (Signed)
Sally Cross is a 32 y.o. at [redacted]w[redacted]d here in MAU reporting: BP elevated when checked at work. Dull HA. Hx pre-e with first preg.  Reports BP has been going up last couple of days.  Denies visual changes or epigastric pain.  Feet are puffy! Denies bleeding or leaking, baby very active.  Onset of complaint: few days Pain score: 1 Vitals:   10/01/22 1323  BP: (!) 141/96  Pulse: 77  Resp: 17  Temp: 98.5 F (36.9 C)  SpO2: 100%     FHT:145 Lab orders placed from triage:  urine collected

## 2022-10-01 NOTE — MAU Provider Note (Signed)
History     CSN: [id number]  Arrival date and time: 10/01/22 1308   Event Date/Time   First Provider Initiated Contact with Patient 10/01/22 1404      Chief Complaint  Patient presents with   Headache   Hypertension   HPI  [name] is a 32 y.o. female G96P0101 @ [redacted]w[redacted]d here in MAU with complaints of elevated BP. She reports over the weekend, on Saturday, her BP was elevated for the first time 127/96. Yesterday in the ob office it was 142/92  Today someone from work checked her BP and it read 144/100 then 152/107. She was instructed to come in.   Reports dull HA upon arrival, No HA now. No vision changes.   OB History     Gravida  2   Para  1   Term      Preterm  1   AB      Living  1      SAB      IAB      Ectopic      Multiple  0   Live Births  1           Past Medical History:  Diagnosis Date   Hypothyroidism     Past Surgical History:  Procedure Laterality Date   NO PAST SURGERIES      Family History  Problem Relation Age of Onset   Hypertension Mother     Social History   Tobacco Use   Smoking status: Never   Smokeless tobacco: Never  Vaping Use   Vaping status: Never Used  Substance Use Topics   Alcohol use: Not Currently   Drug use: Never    Allergies: No Known Allergies  Medications Prior to Admission  Medication Sig Dispense Refill Last Dose   aspirin 81 MG chewable tablet Chew 81 mg by mouth daily.   09/30/2022   levothyroxine (SYNTHROID) 25 MCG tablet Take 25 mcg by mouth daily before breakfast.   09/30/2022   Prenatal Vit-Fe Fumarate-FA (PRENATAL MULTIVITAMIN) TABS tablet Take 1 tablet by mouth daily at 12 noon.   09/30/2022   cholecalciferol (VITAMIN D3) 25 MCG (1000 UNIT) tablet Take 1,000 Units by mouth daily.      labetalol (NORMODYNE) 200 MG tablet Take 2 tablets (400 mg total) by mouth every 8 (eight) hours. 120 tablet 1    NIFEdipine (ADALAT CC) 60 MG 24 hr tablet Take 1 tablet (60 mg total) by mouth  daily. 90 tablet 1    vitamin B-12 (CYANOCOBALAMIN) 500 MCG tablet Take 500 mcg by mouth daily.      Results for orders placed or performed during the hospital encounter of 10/01/22 (from the past 48 hour(s))  Protein / creatinine ratio, urine     Status: None   Collection Time: 10/01/22  1:49 PM  Result Value Ref Range   Creatinine, Urine 31 mg/dL   Total Protein, Urine <6 mg/dL    Comment: NO NORMAL RANGE ESTABLISHED FOR THIS TEST   Protein Creatinine Ratio        0.00 - 0.15 mg/mg[Cre]    Comment: RESULT BELOW REPORTABLE RANGE, UNABLE TO CALCULATE. Performed at [facility], [address] [place], [place] [address]   CBC     Status: Abnormal   Collection Time: 10/01/22  2:00 PM  Result Value Ref Range   WBC 7.3 4.0 - 10.5 K/uL   RBC 3.69 (L) 3.87 - 5.11 MIL/uL   Hemoglobin 10.7 (L) 12.0 -  15.0 g/dL   HCT 95.2 (L) 84.1 - 32.4 %   MCV 85.9 80.0 - 100.0 fL   MCH 29.0 26.0 - 34.0 pg   MCHC 33.8 30.0 - 36.0 g/dL   RDW 40.1 02.7 - 25.3 %   Platelets 140 (L) 150 - 400 K/uL   nRBC 0.0 0.0 - 0.2 %    Comment: Performed at [facility], [address] [place], [place] [address]  Comprehensive metabolic panel     Status: Abnormal   Collection Time: 10/01/22  2:00 PM  Result Value Ref Range   Sodium 134 (L) 135 - 145 mmol/L   Potassium 3.4 (L) 3.5 - 5.1 mmol/L   Chloride 106 98 - 111 mmol/L   CO2 20 (L) 22 - 32 mmol/L   Glucose, Bld 88 70 - 99 mg/dL    Comment: Glucose reference range applies only to samples taken after fasting for at least 8 hours.   BUN 6 6 - 20 mg/dL   Creatinine, Ser 3.47 0.44 - 1.00 mg/dL   Calcium 8.4 (L) 8.9 - 10.3 mg/dL   Total Protein 5.2 (L) 6.5 - 8.1 g/dL   Albumin 2.5 (L) 3.5 - 5.0 g/dL   AST 17 15 - 41 U/L   ALT 11 0 - 44 U/L   Alkaline Phosphatase 123 38 - 126 U/L   Total Bilirubin 0.4 0.3 - 1.2 mg/dL   GFR, Estimated >42 >59 mL/min    Comment: (NOTE) Calculated using the CKD-EPI Creatinine Equation (2021)    Anion gap 8 5 - 15     Comment: Performed at [facility], [address], [place], [place] [address]     Review of Systems  Eyes:  Negative for photophobia and visual disturbance.  Gastrointestinal:  Negative for abdominal pain.  Neurological:  Negative for headaches.   Physical Exam   Blood pressure (!) 130/91, pulse 85, temperature 98.5 F (36.9 C), temperature source Oral, resp. rate 17, height 5\' 7"  (1.702 m), weight 84.3 kg, SpO2 99%, unknown if currently breastfeeding.  Patient Vitals for the past 24 hrs:  BP Temp Temp src Pulse Resp SpO2 Height Weight  10/01/22 1530 (!) 139/93 -- -- 91 -- 99 % -- --  10/01/22 1515 (!) 144/92 -- -- 79 -- 99 % -- --  10/01/22 1500 (!) 130/91 -- -- 85 -- 99 % -- --  10/01/22 1445 132/86 -- -- 87 -- 98 % -- --  10/01/22 1430 137/83 -- -- 76 -- 98 % -- --  10/01/22 1415 135/86 -- -- 93 -- 98 % -- --  10/01/22 1400 138/81 -- -- 78 -- 98 % -- --  10/01/22 1345 -- -- -- -- -- 98 % -- --  10/01/22 1343 130/88 -- -- 76 -- -- -- --  10/01/22 1323 (!) 141/96 98.5 F (36.9 C) Oral 77 17 100 % 5\' 7"  (1.702 m) 84.3 kg    Results for orders placed or performed during the hospital encounter of 10/01/22 (from the past 48 hour(s))  Protein / creatinine ratio, urine     Status: None   Collection Time: 10/01/22  1:49 PM  Result Value Ref Range   Creatinine, Urine 31 mg/dL   Total Protein, Urine <6 mg/dL    Comment: NO NORMAL RANGE ESTABLISHED FOR THIS TEST   Protein Creatinine Ratio        0.00 - 0.15 mg/mg[Cre]    Comment: RESULT BELOW REPORTABLE RANGE, UNABLE TO CALCULATE. Performed at  [facility], [address] [place], [place] [address]   CBC     Status: Abnormal   Collection Time: 10/01/22  2:00 PM  Result Value Ref Range   WBC 7.3 4.0 - 10.5 K/uL   RBC 3.69 (L) 3.87 - 5.11 MIL/uL   Hemoglobin 10.7 (L) 12.0 - 15.0 g/dL   HCT 60.4 (L) 54.0 - 98.1 %   MCV 85.9 80.0 - 100.0 fL   MCH 29.0 26.0 - 34.0 pg   MCHC 33.8 30.0 - 36.0 g/dL   RDW 19.1 47.8 -  29.5 %   Platelets 140 (L) 150 - 400 K/uL   nRBC 0.0 0.0 - 0.2 %    Comment: Performed at [facility], [address], [place], [place] [address]  Comprehensive metabolic panel     Status: Abnormal   Collection Time: 10/01/22  2:00 PM  Result Value Ref Range   Sodium 134 (L) 135 - 145 mmol/L   Potassium 3.4 (L) 3.5 - 5.1 mmol/L   Chloride 106 98 - 111 mmol/L   CO2 20 (L) 22 - 32 mmol/L   Glucose, Bld 88 70 - 99 mg/dL    Comment: Glucose reference range applies only to samples taken after fasting for at least 8 hours.   BUN 6 6 - 20 mg/dL   Creatinine, Ser 8.65 0.44 - 1.00 mg/dL   Calcium 8.4 (L) 8.9 - 10.3 mg/dL   Total Protein 5.2 (L) 6.5 - 8.1 g/dL   Albumin 2.5 (L) 3.5 - 5.0 g/dL   AST 17 15 - 41 U/L   ALT 11 0 - 44 U/L   Alkaline Phosphatase 123 38 - 126 U/L   Total Bilirubin 0.4 0.3 - 1.2 mg/dL   GFR, Estimated >78 >46 mL/min    Comment: (NOTE) Calculated using the CKD-EPI Creatinine Equation (2021)    Anion gap 8 5 - 15    Comment: Performed at [facility], [address], [place], [place] [address]     Physical Exam Constitutional:      General: She is not in acute distress.    Appearance: She is well-developed. She is not ill-appearing, toxic-appearing or diaphoretic.  Neurological:     Mental Status: She is alert and oriented to person, place, and time.     Deep Tendon Reflexes: Reflexes normal (Negative clonus).  Psychiatric:        Behavior: Behavior normal.   Fetal Tracing  Baseline: 135 bpm Variability: moderate  Accelerations: 15x15 Decelerations: None Toco: Occasional with UI   MAU Course  Procedures  MDM  PIH labs without acute findings Patient to have BP check tomorrow in the office.  Reviewed patient with Dr. Henderson Cloud.    Assessment and Plan   A:  1. Elevated BP without diagnosis of hypertension   2. History of severe pre-eclampsia   3. [redacted] weeks gestation of pregnancy      P:  Dc home Return to MAU if symptoms  worsen Preeclampsia precautions Blood pressure check tomorrow with OB office, call to schedule.  Duane Lope, NP 10/01/2022 8:31 PM

## 2022-10-02 LAB — OB RESULTS CONSOLE GBS: GBS: NEGATIVE

## 2022-10-07 ENCOUNTER — Telehealth (HOSPITAL_COMMUNITY): Payer: Self-pay | Admitting: *Deleted

## 2022-10-07 NOTE — Telephone Encounter (Signed)
Preadmission screen  

## 2022-10-08 ENCOUNTER — Encounter (HOSPITAL_COMMUNITY): Payer: Self-pay

## 2022-10-10 ENCOUNTER — Inpatient Hospital Stay (HOSPITAL_COMMUNITY): Payer: BC Managed Care – PPO

## 2022-10-10 ENCOUNTER — Inpatient Hospital Stay (HOSPITAL_COMMUNITY)
Admission: RE | Admit: 2022-10-10 | Discharge: 2022-10-12 | DRG: 807 | Disposition: A | Discharge status: Home / Self Care | Payer: BC Managed Care – PPO | Attending: Obstetrics and Gynecology | Admitting: Obstetrics and Gynecology

## 2022-10-10 ENCOUNTER — Inpatient Hospital Stay (HOSPITAL_COMMUNITY): Payer: BC Managed Care – PPO | Admitting: Anesthesiology

## 2022-10-10 ENCOUNTER — Encounter (HOSPITAL_COMMUNITY): Payer: Self-pay | Admitting: Obstetrics and Gynecology

## 2022-10-10 ENCOUNTER — Other Ambulatory Visit: Payer: Self-pay

## 2022-10-10 DIAGNOSIS — O134 Gestational [pregnancy-induced] hypertension without significant proteinuria, complicating childbirth: Secondary | ICD-10-CM | POA: Diagnosis present

## 2022-10-10 DIAGNOSIS — E039 Hypothyroidism, unspecified: Secondary | ICD-10-CM | POA: Diagnosis present

## 2022-10-10 DIAGNOSIS — O139 Gestational [pregnancy-induced] hypertension without significant proteinuria, unspecified trimester: Principal | ICD-10-CM | POA: Diagnosis present

## 2022-10-10 DIAGNOSIS — Z8249 Family history of ischemic heart disease and other diseases of the circulatory system: Secondary | ICD-10-CM | POA: Diagnosis not present

## 2022-10-10 DIAGNOSIS — O99284 Endocrine, nutritional and metabolic diseases complicating childbirth: Secondary | ICD-10-CM | POA: Diagnosis present

## 2022-10-10 DIAGNOSIS — Z3A37 37 weeks gestation of pregnancy: Secondary | ICD-10-CM

## 2022-10-10 LAB — CBC
HCT: 32.3 % — ABNORMAL LOW (ref 36.0–46.0)
Hemoglobin: 11.1 g/dL — ABNORMAL LOW (ref 12.0–15.0)
MCH: 29.4 pg (ref 26.0–34.0)
MCHC: 34.4 g/dL (ref 30.0–36.0)
MCV: 85.7 fL (ref 80.0–100.0)
Platelets: 154 10*3/uL (ref 150–400)
RBC: 3.77 MIL/uL — ABNORMAL LOW (ref 3.87–5.11)
RDW: 13.2 % (ref 11.5–15.5)
WBC: 7.6 10*3/uL (ref 4.0–10.5)
nRBC: 0 % (ref 0.0–0.2)

## 2022-10-10 LAB — TYPE AND SCREEN
ABO/RH(D): A POS
Antibody Screen: NEGATIVE

## 2022-10-10 LAB — HIV ANTIBODY (ROUTINE TESTING W REFLEX): HIV Screen 4th Generation wRfx: NONREACTIVE

## 2022-10-10 LAB — PLATELET COUNT: Platelets: 141 10*3/uL — ABNORMAL LOW (ref 150–400)

## 2022-10-10 LAB — RPR: RPR Ser Ql: NONREACTIVE

## 2022-10-10 MED ORDER — SENNOSIDES-DOCUSATE SODIUM 8.6-50 MG PO TABS
2.0000 | ORAL_TABLET | Freq: Every day | ORAL | Status: DC
  Administered 2022-10-11 – 2022-10-12 (×2): 2 via ORAL
  Filled 2022-10-10 (×2): qty 2

## 2022-10-10 MED ORDER — COCONUT OIL OIL: 1.0000 | TOPICAL_OIL | Status: DC | PRN

## 2022-10-10 MED ORDER — SIMETHICONE 80 MG PO CHEW: 80.0000 mg | CHEWABLE_TABLET | ORAL | Status: DC | PRN

## 2022-10-10 MED ORDER — PRENATAL MULTIVITAMIN CH
1.0000 | ORAL_TABLET | Freq: Every day | ORAL | Status: DC
  Administered 2022-10-10 – 2022-10-12 (×3): 1 via ORAL
  Filled 2022-10-10 (×4): qty 1

## 2022-10-10 MED ORDER — LIDOCAINE HCL (PF) 1 % IJ SOLN
INTRAMUSCULAR | Status: DC | PRN
  Administered 2022-10-10 (×2): 4 mL via EPIDURAL

## 2022-10-10 MED ORDER — SOD CITRATE-CITRIC ACID 500-334 MG/5ML PO SOLN: 30.0000 mL | ORAL | Status: DC | PRN

## 2022-10-10 MED ORDER — ZOLPIDEM TARTRATE 5 MG PO TABS: 5.0000 mg | ORAL_TABLET | Freq: Every evening | ORAL | Status: DC | PRN

## 2022-10-10 MED ORDER — OXYTOCIN-SODIUM CHLORIDE 30-0.9 UT/500ML-% IV SOLN
2.5000 [IU]/h | INTRAVENOUS | Status: DC
  Filled 2022-10-10: qty 500

## 2022-10-10 MED ORDER — EPHEDRINE 5 MG/ML INJ: 10.0000 mg | INTRAVENOUS | Status: DC | PRN

## 2022-10-10 MED ORDER — OXYTOCIN BOLUS FROM INFUSION
333.0000 mL | Freq: Once | INTRAVENOUS | Status: AC
  Administered 2022-10-10: 333 mL via INTRAVENOUS

## 2022-10-10 MED ORDER — ACETAMINOPHEN 325 MG PO TABS: 650.0000 mg | ORAL_TABLET | ORAL | Status: DC | PRN

## 2022-10-10 MED ORDER — DIPHENHYDRAMINE HCL 50 MG/ML IJ SOLN: 12.5000 mg | INTRAMUSCULAR | Status: DC | PRN

## 2022-10-10 MED ORDER — WITCH HAZEL-GLYCERIN EX PADS: 1.0000 | MEDICATED_PAD | CUTANEOUS | Status: DC | PRN

## 2022-10-10 MED ORDER — OXYTOCIN-SODIUM CHLORIDE 30-0.9 UT/500ML-% IV SOLN: 1.0000 m[IU]/min | INTRAVENOUS | Status: DC

## 2022-10-10 MED ORDER — ONDANSETRON HCL 4 MG/2ML IJ SOLN: 4.0000 mg | INTRAMUSCULAR | Status: DC | PRN

## 2022-10-10 MED ORDER — MEASLES, MUMPS & RUBELLA VAC IJ SOLR: 0.5000 mL | Freq: Once | INTRAMUSCULAR | Status: DC

## 2022-10-10 MED ORDER — ONDANSETRON HCL 4 MG/2ML IJ SOLN: 4.0000 mg | Freq: Four times a day (QID) | INTRAMUSCULAR | Status: DC | PRN

## 2022-10-10 MED ORDER — FENTANYL-BUPIVACAINE-NACL 0.5-0.125-0.9 MG/250ML-% EP SOLN
12.0000 mL/h | EPIDURAL | Status: DC | PRN
  Administered 2022-10-10: 12 mL/h via EPIDURAL

## 2022-10-10 MED ORDER — MISOPROSTOL 25 MCG QUARTER TABLET
25.0000 ug | ORAL_TABLET | Freq: Once | ORAL | Status: AC
  Administered 2022-10-10: 25 ug via VAGINAL
  Filled 2022-10-10: qty 1

## 2022-10-10 MED ORDER — PHENYLEPHRINE 80 MCG/ML (10ML) SYRINGE FOR IV PUSH (FOR BLOOD PRESSURE SUPPORT): 80.0000 ug | PREFILLED_SYRINGE | INTRAVENOUS | Status: DC | PRN

## 2022-10-10 MED ORDER — LACTATED RINGERS IV SOLN: INTRAVENOUS | Status: DC

## 2022-10-10 MED ORDER — MEDROXYPROGESTERONE ACETATE 150 MG/ML IM SUSP: 150.0000 mg | INTRAMUSCULAR | Status: DC | PRN

## 2022-10-10 MED ORDER — BISACODYL 10 MG RE SUPP: 10.0000 mg | Freq: Every day | RECTAL | Status: DC | PRN

## 2022-10-10 MED ORDER — TERBUTALINE SULFATE 1 MG/ML IJ SOLN: 0.2500 mg | Freq: Once | INTRAMUSCULAR | Status: DC | PRN

## 2022-10-10 MED ORDER — DIPHENHYDRAMINE HCL 25 MG PO CAPS: 25.0000 mg | ORAL_CAPSULE | Freq: Four times a day (QID) | ORAL | Status: DC | PRN

## 2022-10-10 MED ORDER — LIDOCAINE HCL (PF) 1 % IJ SOLN: 30.0000 mL | INTRAMUSCULAR | Status: DC | PRN

## 2022-10-10 MED ORDER — OXYCODONE HCL 5 MG PO TABS: 5.0000 mg | ORAL_TABLET | ORAL | Status: DC | PRN

## 2022-10-10 MED ORDER — LACTATED RINGERS IV SOLN
500.0000 mL | Freq: Once | INTRAVENOUS | Status: AC
  Administered 2022-10-10: 500 mL via INTRAVENOUS

## 2022-10-10 MED ORDER — FLEET ENEMA RE ENEM: 1.0000 | ENEMA | Freq: Every day | RECTAL | Status: DC | PRN

## 2022-10-10 MED ORDER — OXYCODONE-ACETAMINOPHEN 5-325 MG PO TABS: 1.0000 | ORAL_TABLET | ORAL | Status: DC | PRN

## 2022-10-10 MED ORDER — IBUPROFEN 600 MG PO TABS
600.0000 mg | ORAL_TABLET | Freq: Four times a day (QID) | ORAL | Status: DC
  Administered 2022-10-10 – 2022-10-12 (×8): 600 mg via ORAL
  Filled 2022-10-10 (×9): qty 1

## 2022-10-10 MED ORDER — MISOPROSTOL 25 MCG QUARTER TABLET
25.0000 ug | ORAL_TABLET | Freq: Once | ORAL | Status: AC
  Administered 2022-10-10: 25 ug via ORAL
  Filled 2022-10-10: qty 1

## 2022-10-10 MED ORDER — TETANUS-DIPHTH-ACELL PERTUSSIS 5-2.5-18.5 LF-MCG/0.5 IM SUSY: 0.5000 mL | PREFILLED_SYRINGE | Freq: Once | INTRAMUSCULAR | Status: DC

## 2022-10-10 MED ORDER — BENZOCAINE-MENTHOL 20-0.5 % EX AERO: 1.0000 | INHALATION_SPRAY | CUTANEOUS | Status: DC | PRN

## 2022-10-10 MED ORDER — LACTATED RINGERS IV SOLN: 500.0000 mL | INTRAVENOUS | Status: DC | PRN

## 2022-10-10 MED ORDER — OXYCODONE-ACETAMINOPHEN 5-325 MG PO TABS: 2.0000 | ORAL_TABLET | ORAL | Status: DC | PRN

## 2022-10-10 MED ORDER — FENTANYL-BUPIVACAINE-NACL 0.5-0.125-0.9 MG/250ML-% EP SOLN
EPIDURAL | Status: AC
  Filled 2022-10-10: qty 250

## 2022-10-10 MED ORDER — OXYCODONE HCL 5 MG PO TABS: 10.0000 mg | ORAL_TABLET | ORAL | Status: DC | PRN

## 2022-10-10 MED ORDER — DIBUCAINE (PERIANAL) 1 % EX OINT: 1.0000 | TOPICAL_OINTMENT | CUTANEOUS | Status: DC | PRN

## 2022-10-10 MED ORDER — ACETAMINOPHEN 325 MG PO TABS
650.0000 mg | ORAL_TABLET | ORAL | Status: DC | PRN
  Administered 2022-10-11: 650 mg via ORAL
  Filled 2022-10-10: qty 2

## 2022-10-10 MED ORDER — ONDANSETRON HCL 4 MG PO TABS: 4.0000 mg | ORAL_TABLET | ORAL | Status: DC | PRN

## 2022-10-10 NOTE — Anesthesia Procedure Notes (Signed)
Epidural Patient location during procedure: OB Start time: 10/10/2022 9:10 AM End time: 10/10/2022 9:15 AM  Staffing Anesthesiologist: Linton Rump, MD Performed: anesthesiologist   Preanesthetic Checklist Completed: patient identified, IV checked, site marked, risks and benefits discussed, surgical consent, monitors and equipment checked, pre-op evaluation and timeout performed  Epidural Patient position: sitting Prep: DuraPrep and site prepped and draped Patient monitoring: continuous pulse ox and blood pressure Approach: midline Location: L3-L4 Injection technique: LOR saline  Needle:  Needle type: Tuohy  Needle gauge: 17 G Needle length: 9 cm and 9 Needle insertion depth: 5 cm Catheter type: closed end flexible Catheter size: 19 Gauge Catheter at skin depth: 9 cm Test dose: negative  Assessment Events: blood not aspirated, no cerebrospinal fluid, injection not painful, no injection resistance, no paresthesia and negative IV test  Additional Notes The patient has requested an epidural for labor pain management. Risks and benefits including, but not limited to, infection, bleeding, local anesthetic toxicity, headache, hypotension, back pain, block failure, etc. were discussed with the patient. The patient expressed understanding and consented to the procedure. I confirmed that the patient has no bleeding disorders and is not taking blood thinners. I confirmed the patient's last platelet count with the nurse. A time-out was performed immediately prior to the procedure. Please see nursing documentation for vital signs. Sterile technique was used throughout the whole procedure. Once LOR achieved, the epidural catheter threaded easily without resistance. Aspiration of the catheter was negative for blood and CSF. The epidural was dosed slowly and an infusion was started.  1 attempt(s)Reason for block:procedure for pain

## 2022-10-10 NOTE — Progress Notes (Signed)
Called and spoke with Dr. Vincente Poli regarding patients BP. Advised to call back if diastolic stays above 95. No parameters given for Systolic.

## 2022-10-10 NOTE — H&P (Signed)
Sally Cross is a 32 y.o. G 2 P 0101 at 37 weeks presents for IOL secondary to gestational hypertension. Status post one cytotec. OB History     Gravida  2   Para  1   Term      Preterm  1   AB      Living  1      SAB      IAB      Ectopic      Multiple  0   Live Births  1          Past Medical History:  Diagnosis Date   Hypothyroidism    Past Surgical History:  Procedure Laterality Date   NO PAST SURGERIES     Family History: family history includes Hypertension in her mother. Social History:  reports that she has never smoked. She has never used smokeless tobacco. She reports that she does not currently use alcohol. She reports that she does not use drugs.     Maternal Diabetes: No Genetic Screening: Normal Maternal Ultrasounds/Referrals: Normal Fetal Ultrasounds or other Referrals:  None Maternal Substance Abuse:  No Significant Maternal Medications:  None Significant Maternal Lab Results:  None Number of Prenatal Visits:greater than 3 verified prenatal visits Maternal Vaccinations:TDap Other Comments:  None  Review of Systems  All other systems reviewed and are negative.  Maternal Medical History:  Prenatal complications: PIH.     Dilation: 2 Effacement (%): 60 Station: -2 Exam by:: Sally Cross Blood pressure 115/74, pulse 64, temperature 98.5 F (36.9 C), temperature source Axillary, height 5\' 7"  (1.702 m), weight 85.3 kg, SpO2 99%, unknown if currently breastfeeding. Maternal Exam:  Uterine Assessment: Contraction strength is mild.  Contraction frequency is irregular.  Abdomen: Fetal presentation: vertex   Fetal Exam Fetal State Assessment: Category I - tracings are normal.   Physical Exam Vitals and nursing note reviewed. Exam conducted with a chaperone present.  Constitutional:      Appearance: Normal appearance.  HENT:     Head: Normocephalic.  Cardiovascular:     Rate and Rhythm: Normal rate and regular rhythm.  Pulmonary:      Effort: Pulmonary effort is normal.  Neurological:     Mental Status: She is alert.     Prenatal labs: ABO, Rh: --/--/A POS (10/03 0100) Antibody: NEG (10/03 0100) Rubella: Immune (03/26 0000) RPR: NON REACTIVE (10/03 0026)  HBsAg: Negative (03/26 0000)  HIV: Non-reactive (03/26 0000)  GBS: Negative/-- (09/25 0000)   Assessment/Plan: IUP at [redacted] weeks Gestational hypertension Pitocin as needed  Epidural when patient requests   Sally Cross 10/10/2022, 8:09 AM

## 2022-10-10 NOTE — Anesthesia Preprocedure Evaluation (Addendum)
Anesthesia Evaluation  Patient identified by MRN, date of birth, ID band Patient awake    Reviewed: Allergy & Precautions, NPO status , Patient's Chart, lab work & pertinent test results  History of Anesthesia Complications Negative for: history of anesthetic complications  Airway Mallampati: II  TM Distance: >3 FB Neck ROM: Full    Dental   Pulmonary neg pulmonary ROS   Pulmonary exam normal breath sounds clear to auscultation       Cardiovascular hypertension (gestational),  Rhythm:Regular Rate:Normal     Neuro/Psych negative neurological ROS     GI/Hepatic negative GI ROS, Neg liver ROS,,,  Endo/Other  Hypothyroidism    Renal/GU negative Renal ROS     Musculoskeletal   Abdominal   Peds  Hematology negative hematology ROS (+) Lab Results      Component                Value               Date                      WBC                      7.6                 10/10/2022                HGB                      11.1 (L)            10/10/2022                HCT                      32.3 (L)            10/10/2022                MCV                      85.7                10/10/2022                PLT                      154                 10/10/2022              Anesthesia Other Findings   Reproductive/Obstetrics (+) Pregnancy                              Anesthesia Physical Anesthesia Plan  ASA: 2  Anesthesia Plan: Epidural   Post-op Pain Management:    Induction:   PONV Risk Score and Plan:   Airway Management Planned:   Additional Equipment:   Intra-op Plan:   Post-operative Plan:   Informed Consent: I have reviewed the patients History and Physical, chart, labs and discussed the procedure including the risks, benefits and alternatives for the proposed anesthesia with the patient or authorized representative who has indicated his/her understanding and acceptance.        Plan Discussed with: Anesthesiologist  Anesthesia Plan Comments: (I have discussed risks of  neuraxial anesthesia including but not limited to infection, bleeding, nerve injury, back pain, headache, seizures, and failure of block. Patient denies bleeding disorders and is not currently anticoagulated. Labs have been reviewed. Risks and benefits discussed. All patient's questions answered.  )         Anesthesia Quick Evaluation

## 2022-10-11 LAB — CBC
HCT: 28.7 % — ABNORMAL LOW (ref 36.0–46.0)
Hemoglobin: 9.5 g/dL — ABNORMAL LOW (ref 12.0–15.0)
MCH: 28.2 pg (ref 26.0–34.0)
MCHC: 33.1 g/dL (ref 30.0–36.0)
MCV: 85.2 fL (ref 80.0–100.0)
Platelets: 143 10*3/uL — ABNORMAL LOW (ref 150–400)
RBC: 3.37 MIL/uL — ABNORMAL LOW (ref 3.87–5.11)
RDW: 13.2 % (ref 11.5–15.5)
WBC: 8.4 10*3/uL (ref 4.0–10.5)
nRBC: 0 % (ref 0.0–0.2)

## 2022-10-11 MED ORDER — LABETALOL HCL 100 MG PO TABS
100.0000 mg | ORAL_TABLET | Freq: Two times a day (BID) | ORAL | Status: DC
  Administered 2022-10-11: 100 mg via ORAL
  Filled 2022-10-11: qty 1

## 2022-10-11 MED ORDER — LEVOTHYROXINE SODIUM 50 MCG PO TABS
25.0000 ug | ORAL_TABLET | Freq: Every day | ORAL | Status: DC
  Administered 2022-10-11 – 2022-10-12 (×2): 25 ug via ORAL
  Filled 2022-10-11 (×2): qty 1

## 2022-10-11 MED ORDER — LABETALOL HCL 200 MG PO TABS
200.0000 mg | ORAL_TABLET | Freq: Two times a day (BID) | ORAL | Status: DC
  Administered 2022-10-11: 200 mg via ORAL
  Filled 2022-10-11: qty 1

## 2022-10-11 MED ORDER — LABETALOL HCL 200 MG PO TABS
300.0000 mg | ORAL_TABLET | Freq: Three times a day (TID) | ORAL | Status: DC
  Administered 2022-10-12 (×2): 300 mg via ORAL
  Filled 2022-10-11 (×2): qty 1

## 2022-10-11 NOTE — Anesthesia Postprocedure Evaluation (Signed)
Anesthesia Post Note  Patient: Sally Cross  Procedure(s) Performed: AN AD HOC LABOR EPIDURAL     Patient location during evaluation: Mother Baby Anesthesia Type: Epidural Level of consciousness: awake and alert Pain management: pain level controlled Vital Signs Assessment: post-procedure vital signs reviewed and stable Respiratory status: spontaneous breathing, nonlabored ventilation and respiratory function stable Cardiovascular status: stable Postop Assessment: no headache, no backache and epidural receding Anesthetic complications: no   No notable events documented.  Last Vitals:  Vitals:   10/11/22 0009 10/11/22 0505  BP: (!) 140/91 (!) 135/99  Pulse: 69 61  Resp: 18 18  Temp: 36.8 C 36.8 C  SpO2: 97% 99%    Last Pain:  Vitals:   10/11/22 0505  TempSrc: Oral  PainSc:    Pain Goal:                   Osha Errico

## 2022-10-11 NOTE — Progress Notes (Signed)
Post Partum Day 1 Subjective: no complaints, up ad lib, voiding, and tolerating PO.  No HA, vision change, RUQ pain, CP/SOB.  Desires circ prior to d/c; baby boy doing well in NICU with decreased requirement of respiratory support and feeding well.  Objective: Blood pressure (!) 135/99, pulse 61, temperature 98.3 F (36.8 C), temperature source Oral, resp. rate 18, height 5\' 7"  (1.702 m), weight 85.3 kg, SpO2 99%, unknown if currently breastfeeding.  Physical Exam:  General: alert, cooperative, and appears stated age 32: appropriate Uterine Fundus: firm Incision: healing well, no significant drainage, no dehiscence DVT Evaluation: No evidence of DVT seen on physical exam. Negative Homan's sign. No cords or calf tenderness.  Recent Labs    10/10/22 0026 10/11/22 0627  HGB 11.1* 9.5*  HCT 32.3* 28.7*    Assessment/Plan: Plan for discharge tomorrow, Breastfeeding, and Circumcision prior to discharge GHTN-labetalol increased from 100 BID to 200 BID this am to optimize control.  Patient is asymptomatic.  CTM BPs. Plan circ tomorrow if newborn is stable   LOS: 1 day   Mitchel Honour, DO 10/11/2022, 8:32 AM

## 2022-10-11 NOTE — Progress Notes (Signed)
Patient screened out for psychosocial assessment since none of the following apply:  Psychosocial stressors documented in mother or baby's chart  Gestation less than 32 weeks  Code at delivery   Infant with anomalies Please contact the Clinical Social Worker if specific needs arise, by MOB's request, or if MOB scores greater than 9/yes to question 10 on Edinburgh Postpartum Depression Screen.  Sui Kasparek, LCSW Clinical Social Worker [facility] Cell#: [phone]     

## 2022-10-12 ENCOUNTER — Encounter (HOSPITAL_COMMUNITY): Payer: Self-pay | Admitting: Obstetrics and Gynecology

## 2022-10-12 MED ORDER — IBUPROFEN 600 MG PO TABS: 600.0000 mg | ORAL_TABLET | Freq: Four times a day (QID) | ORAL | 0 refills | Status: AC

## 2022-10-12 MED ORDER — LABETALOL HCL 300 MG PO TABS: 300.0000 mg | ORAL_TABLET | Freq: Three times a day (TID) | ORAL | 0 refills | Status: AC

## 2022-10-12 NOTE — Discharge Instructions (Signed)
Call MD for T>100.4, heavy vaginal bleeding, severe abdominal pain, or respiratory distress.  Call office to schedule pp BP check next week.  Pelvic rest x 6 weeks.

## 2022-10-12 NOTE — Discharge Summary (Signed)
Postpartum Discharge Summary  Patient Name: Sally Cross DOB: Jun 11, 1990 MRN: [id number]  Date of admission: 10/10/2022 Delivery date:10/10/2022 Delivering provider: Marcelle Overlie Date of discharge: 10/12/2022  Admitting diagnosis: Gestational hypertension [O13.9] NSVD (normal spontaneous vaginal delivery) [O80] Intrauterine pregnancy: [redacted]w[redacted]d     Secondary diagnosis:  Principal Problem:   Gestational hypertension Active Problems:   NSVD (normal spontaneous vaginal delivery)  Additional problems: none    Discharge diagnosis: Term Pregnancy Delivered and Gestational Hypertension                                              Post partum procedures: none Augmentation: AROM and Cytotec Complications: None  Hospital course: Induction of Labor With Vaginal Delivery   32 y.o. yo [id number] at [redacted]w[redacted]d was admitted to the hospital 10/10/2022 for induction of labor.  Indication for induction: Gestational hypertension.  Patient had an labor course complicated by n/a Membrane Rupture Time/Date: 7:50 AM,10/10/2022  Delivery Method:Vaginal, Spontaneous Operative Delivery:N/A Episiotomy: None Lacerations:  2nd degree Details of delivery can be found in separate delivery note.  Patient had a postpartum course complicated by persistently elevated BPs.  Labetalol was titrated up to 300 TID for ultimate control. Patient is discharged home 10/12/22.  Newborn Data: Birth date:10/10/2022 Birth time:11:44 AM Gender:Female Living status:Living Apgars:9 ,9  Weight:2960 g  Magnesium Sulfate received: No BMZ received: No Rhophylac:No MMR:No T-DaP:Given prenatally Flu: No RSV Vaccine received: No Transfusion:No  Immunizations received: Immunization History  Administered Date(s) Administered   Influenza,inj,Quad PF,6+ Mos 10/12/2019    Physical exam  Vitals:   10/12/22 0602 10/12/22 0759 10/12/22 1302 10/12/22 1618  BP: (!) 137/92 (!) 132/93 (!) 132/91 133/88  Pulse: (!) 58 64 (!) 59 72   Resp: 17 17 17    Temp:  98.6 F (37 C) 98.9 F (37.2 C)   TempSrc:  Oral Oral   SpO2: 100% 98% 100% 100%  Weight:      Height:       General: alert, cooperative, and no distress Lochia: appropriate Uterine Fundus: firm Incision: Healing well with no significant drainage, No significant erythema DVT Evaluation: No evidence of DVT seen on physical exam. Negative Homan's sign. No cords or calf tenderness. Labs: Lab Results  Component Value Date   WBC 8.4 10/11/2022   HGB 9.5 (L) 10/11/2022   HCT 28.7 (L) 10/11/2022   MCV 85.2 10/11/2022   PLT 143 (L) 10/11/2022      Latest Ref Rng & Units 10/01/2022    2:00 PM  CMP  Glucose 70 - 99 mg/dL 88   BUN 6 - 20 mg/dL 6   Creatinine 8.84 - 1.66 mg/dL 0.63   Sodium 016 - 010 mmol/L 134   Potassium 3.5 - 5.1 mmol/L 3.4   Chloride 98 - 111 mmol/L 106   CO2 22 - 32 mmol/L 20   Calcium 8.9 - 10.3 mg/dL 8.4   Total Protein 6.5 - 8.1 g/dL 5.2   Total Bilirubin 0.3 - 1.2 mg/dL 0.4   Alkaline Phos 38 - 126 U/L 123   AST 15 - 41 U/L 17   ALT 0 - 44 U/L 11    Edinburgh Score:    10/11/2022    8:03 AM  Edinburgh Postnatal Depression Scale Screening Tool  I have been able to laugh and see the funny side of things. 0  I have looked  forward with enjoyment to things. 0  I have blamed myself unnecessarily when things went wrong. 0  I have been anxious or worried for no good reason. 0  I have felt scared or panicky for no good reason. 0  Things have been getting on top of me. 0  I have been so unhappy that I have had difficulty sleeping. 0  I have felt sad or miserable. 0  I have been so unhappy that I have been crying. 0  The thought of harming myself has occurred to me. 0  Edinburgh Postnatal Depression Scale Total 0   Edinburgh Postnatal Depression Scale Total: 0   After visit meds:  Allergies as of 10/12/2022   No Known Allergies      Medication List     STOP taking these medications    aspirin 81 MG chewable tablet        TAKE these medications    ibuprofen 600 MG tablet Commonly known as: ADVIL Take 1 tablet (600 mg total) by mouth every 6 (six) hours.   labetalol 300 MG tablet Commonly known as: NORMODYNE Take 1 tablet (300 mg total) by mouth every 8 (eight) hours.   levothyroxine 25 MCG tablet Commonly known as: SYNTHROID Take 25 mcg by mouth daily before breakfast.   prenatal multivitamin Tabs tablet Take 1 tablet by mouth daily at 12 noon.         Discharge home in stable condition Infant Feeding: Breast Infant Disposition:NICU Discharge instruction: per After Visit Summary and Postpartum booklet. Activity: Advance as tolerated. Pelvic rest for 6 weeks.  Diet: routine diet Future Appointments:No future appointments. Follow up Visit: BP check next week   10/12/2022 Mitchel Honour, DO

## 2022-10-12 NOTE — Progress Notes (Signed)
Post Partum Day 2 Subjective: no complaints, up ad lib, voiding, and tolerating PO.  No HA, vision change, RUQ pain, CP/SOB.  Baby boy still in NICU.  Objective: Blood pressure (!) 137/92, pulse (!) 58, temperature 98.3 F (36.8 C), temperature source Oral, resp. rate 17, height 5\' 7"  (1.702 m), weight 85.3 kg, SpO2 100%, unknown if currently breastfeeding.  Physical Exam:  General: alert, cooperative, and appears stated age 32: appropriate Uterine Fundus: firm Incision: healing well, no significant drainage, no significant erythema DVT Evaluation: No evidence of DVT seen on physical exam. Negative Homan's sign. No cords or calf tenderness.  Recent Labs    10/10/22 0026 10/11/22 0627  HGB 11.1* 9.5*  HCT 32.3* 28.7*    Assessment/Plan: GHTN-increased to labetalol 300 q 8h.  Will continue to monitor BPs today to ensure control; ordered BPs q 4 h.  Continues to be asymptomatic If adequate BP control this PM, will likely D/C home.   Meeting all other PP goals.   LOS: 2 days   Mitchel Honour, DO 10/12/2022, 7:33 AM

## 2022-11-05 ENCOUNTER — Telehealth (HOSPITAL_COMMUNITY): Payer: Self-pay | Admitting: *Deleted

## 2022-11-05 NOTE — Telephone Encounter (Signed)
11/05/2022  Name: Sally Cross MRN: [id number] DOB: 1990/10/03  Reason for Call:  Transition of Care Hospital Discharge Call  Contact Status: Patient Contact Status: Message  Language assistant needed:          Follow-Up Questions:    Inocente Salles Postnatal Depression Scale:  In the Past 7 Days:    PHQ2-9 Depression Scale:     Discharge Follow-up:    Post-discharge interventions: NA  Salena Saner, RN 11/05/2022 15:35

## 2023-03-07 DIAGNOSIS — E063 Autoimmune thyroiditis: Secondary | ICD-10-CM | POA: Diagnosis not present

## 2023-05-30 DIAGNOSIS — E063 Autoimmune thyroiditis: Secondary | ICD-10-CM | POA: Diagnosis not present

## 2023-07-25 DIAGNOSIS — E063 Autoimmune thyroiditis: Secondary | ICD-10-CM | POA: Diagnosis not present

## 2023-08-04 DIAGNOSIS — E538 Deficiency of other specified B group vitamins: Secondary | ICD-10-CM | POA: Diagnosis not present

## 2023-08-04 DIAGNOSIS — E559 Vitamin D deficiency, unspecified: Secondary | ICD-10-CM | POA: Diagnosis not present

## 2023-08-04 DIAGNOSIS — E063 Autoimmune thyroiditis: Secondary | ICD-10-CM | POA: Diagnosis not present

## 2023-08-04 DIAGNOSIS — R5383 Other fatigue: Secondary | ICD-10-CM | POA: Diagnosis not present

## 2023-08-04 DIAGNOSIS — E049 Nontoxic goiter, unspecified: Secondary | ICD-10-CM | POA: Diagnosis not present

## 2023-10-31 DIAGNOSIS — Z124 Encounter for screening for malignant neoplasm of cervix: Secondary | ICD-10-CM | POA: Diagnosis not present

## 2023-10-31 DIAGNOSIS — E063 Autoimmune thyroiditis: Secondary | ICD-10-CM | POA: Diagnosis not present

## 2023-10-31 DIAGNOSIS — Z1151 Encounter for screening for human papillomavirus (HPV): Secondary | ICD-10-CM | POA: Diagnosis not present

## 2023-10-31 DIAGNOSIS — Z30431 Encounter for routine checking of intrauterine contraceptive device: Secondary | ICD-10-CM | POA: Diagnosis not present

## 2023-10-31 DIAGNOSIS — R5383 Other fatigue: Secondary | ICD-10-CM | POA: Diagnosis not present

## 2023-10-31 DIAGNOSIS — Z6824 Body mass index (BMI) 24.0-24.9, adult: Secondary | ICD-10-CM | POA: Diagnosis not present

## 2023-10-31 DIAGNOSIS — Z01419 Encounter for gynecological examination (general) (routine) without abnormal findings: Secondary | ICD-10-CM | POA: Diagnosis not present

## 2024-01-23 ENCOUNTER — Ambulatory Visit (HOSPITAL_COMMUNITY)
Admission: RE | Admit: 2024-01-23 | Discharge: 2024-01-23 | Disposition: A | Discharge status: Home / Self Care | Source: Ambulatory Visit | Attending: Surgery | Admitting: Surgery

## 2024-01-23 ENCOUNTER — Other Ambulatory Visit (HOSPITAL_COMMUNITY): Payer: Self-pay | Admitting: Obstetrics and Gynecology

## 2024-01-23 DIAGNOSIS — R600 Localized edema: Secondary | ICD-10-CM | POA: Insufficient documentation
